# Patient Record
Sex: Male | Born: 1985 | Race: White | Hispanic: No | Marital: Married | Smoking: Never smoker
Health system: Southern US, Community
[De-identification: ages and names within clinical notes are randomized; demographics above are authoritative.]

## PROBLEM LIST (undated history)

## (undated) DIAGNOSIS — F988 Other specified behavioral and emotional disorders with onset usually occurring in childhood and adolescence: Secondary | ICD-10-CM

## (undated) DIAGNOSIS — K219 Gastro-esophageal reflux disease without esophagitis: Secondary | ICD-10-CM

## (undated) DIAGNOSIS — G4750 Parasomnia, unspecified: Secondary | ICD-10-CM

## (undated) DIAGNOSIS — F1010 Alcohol abuse, uncomplicated: Secondary | ICD-10-CM

## (undated) DIAGNOSIS — G4710 Hypersomnia, unspecified: Secondary | ICD-10-CM

## (undated) HISTORY — DX: Parasomnia, unspecified: G47.50

## (undated) HISTORY — PX: TONSILLECTOMY: SUR1361

## (undated) HISTORY — DX: Alcohol abuse, uncomplicated: F10.10

## (undated) HISTORY — PX: WISDOM TOOTH EXTRACTION: SHX21

## (undated) HISTORY — PX: OTHER SURGICAL HISTORY: SHX169

## (undated) HISTORY — DX: Hypersomnia, unspecified: G47.10

## (undated) HISTORY — DX: Gastro-esophageal reflux disease without esophagitis: K21.9

## (undated) HISTORY — DX: Other specified behavioral and emotional disorders with onset usually occurring in childhood and adolescence: F98.8

---

## 2006-06-02 ENCOUNTER — Emergency Department: Payer: Self-pay | Admitting: Internal Medicine

## 2006-11-21 ENCOUNTER — Ambulatory Visit: Payer: Self-pay | Admitting: Internal Medicine

## 2006-11-21 DIAGNOSIS — G5600 Carpal tunnel syndrome, unspecified upper limb: Secondary | ICD-10-CM | POA: Insufficient documentation

## 2006-11-21 DIAGNOSIS — K219 Gastro-esophageal reflux disease without esophagitis: Secondary | ICD-10-CM | POA: Insufficient documentation

## 2006-11-21 DIAGNOSIS — F988 Other specified behavioral and emotional disorders with onset usually occurring in childhood and adolescence: Secondary | ICD-10-CM | POA: Insufficient documentation

## 2006-12-06 ENCOUNTER — Encounter (INDEPENDENT_AMBULATORY_CARE_PROVIDER_SITE_OTHER): Payer: Self-pay | Admitting: Internal Medicine

## 2006-12-12 ENCOUNTER — Telehealth (INDEPENDENT_AMBULATORY_CARE_PROVIDER_SITE_OTHER): Payer: Self-pay | Admitting: *Deleted

## 2007-01-11 ENCOUNTER — Ambulatory Visit: Payer: Self-pay | Admitting: Internal Medicine

## 2007-01-11 DIAGNOSIS — M24419 Recurrent dislocation, unspecified shoulder: Secondary | ICD-10-CM | POA: Insufficient documentation

## 2007-01-11 DIAGNOSIS — J309 Allergic rhinitis, unspecified: Secondary | ICD-10-CM | POA: Insufficient documentation

## 2007-01-11 DIAGNOSIS — G2581 Restless legs syndrome: Secondary | ICD-10-CM | POA: Insufficient documentation

## 2007-01-23 ENCOUNTER — Encounter (INDEPENDENT_AMBULATORY_CARE_PROVIDER_SITE_OTHER): Payer: Self-pay | Admitting: Internal Medicine

## 2007-01-24 ENCOUNTER — Ambulatory Visit: Payer: Self-pay | Admitting: Pulmonary Disease

## 2007-01-29 ENCOUNTER — Encounter: Payer: Self-pay | Admitting: Pulmonary Disease

## 2007-01-29 ENCOUNTER — Ambulatory Visit: Payer: Self-pay | Admitting: Family Medicine

## 2007-01-29 ENCOUNTER — Ambulatory Visit (HOSPITAL_BASED_OUTPATIENT_CLINIC_OR_DEPARTMENT_OTHER): Admission: RE | Admit: 2007-01-29 | Discharge: 2007-01-29 | Payer: Self-pay | Admitting: Pulmonary Disease

## 2007-02-05 ENCOUNTER — Ambulatory Visit: Payer: Self-pay | Admitting: Pulmonary Disease

## 2007-02-14 ENCOUNTER — Telehealth (INDEPENDENT_AMBULATORY_CARE_PROVIDER_SITE_OTHER): Payer: Self-pay | Admitting: Internal Medicine

## 2007-02-21 ENCOUNTER — Encounter (INDEPENDENT_AMBULATORY_CARE_PROVIDER_SITE_OTHER): Payer: Self-pay | Admitting: Internal Medicine

## 2007-02-27 ENCOUNTER — Ambulatory Visit: Payer: Self-pay | Admitting: Pulmonary Disease

## 2007-03-01 DIAGNOSIS — G4710 Hypersomnia, unspecified: Secondary | ICD-10-CM

## 2007-03-06 ENCOUNTER — Telehealth (INDEPENDENT_AMBULATORY_CARE_PROVIDER_SITE_OTHER): Payer: Self-pay | Admitting: *Deleted

## 2007-03-07 ENCOUNTER — Telehealth: Payer: Self-pay | Admitting: Pulmonary Disease

## 2007-03-13 ENCOUNTER — Encounter (INDEPENDENT_AMBULATORY_CARE_PROVIDER_SITE_OTHER): Payer: Self-pay | Admitting: Internal Medicine

## 2007-03-22 ENCOUNTER — Encounter: Payer: Self-pay | Admitting: Pulmonary Disease

## 2007-03-25 ENCOUNTER — Encounter: Payer: Self-pay | Admitting: Family Medicine

## 2007-04-03 ENCOUNTER — Telehealth (INDEPENDENT_AMBULATORY_CARE_PROVIDER_SITE_OTHER): Payer: Self-pay | Admitting: *Deleted

## 2007-04-08 ENCOUNTER — Encounter (INDEPENDENT_AMBULATORY_CARE_PROVIDER_SITE_OTHER): Payer: Self-pay | Admitting: Internal Medicine

## 2007-04-29 ENCOUNTER — Ambulatory Visit: Payer: Self-pay | Admitting: Pulmonary Disease

## 2007-04-29 DIAGNOSIS — G478 Other sleep disorders: Secondary | ICD-10-CM

## 2007-05-01 ENCOUNTER — Telehealth (INDEPENDENT_AMBULATORY_CARE_PROVIDER_SITE_OTHER): Payer: Self-pay | Admitting: *Deleted

## 2007-05-03 ENCOUNTER — Telehealth (INDEPENDENT_AMBULATORY_CARE_PROVIDER_SITE_OTHER): Payer: Self-pay | Admitting: Internal Medicine

## 2007-05-21 ENCOUNTER — Telehealth (INDEPENDENT_AMBULATORY_CARE_PROVIDER_SITE_OTHER): Payer: Self-pay | Admitting: *Deleted

## 2007-06-05 ENCOUNTER — Telehealth (INDEPENDENT_AMBULATORY_CARE_PROVIDER_SITE_OTHER): Payer: Self-pay | Admitting: Internal Medicine

## 2007-06-24 ENCOUNTER — Telehealth: Payer: Self-pay | Admitting: Pulmonary Disease

## 2007-06-27 ENCOUNTER — Telehealth (INDEPENDENT_AMBULATORY_CARE_PROVIDER_SITE_OTHER): Payer: Self-pay | Admitting: *Deleted

## 2007-07-05 ENCOUNTER — Telehealth: Payer: Self-pay | Admitting: Family Medicine

## 2007-07-13 ENCOUNTER — Emergency Department: Payer: Self-pay | Admitting: Emergency Medicine

## 2007-07-17 ENCOUNTER — Ambulatory Visit: Payer: Self-pay | Admitting: Gastroenterology

## 2007-07-17 DIAGNOSIS — K920 Hematemesis: Secondary | ICD-10-CM | POA: Insufficient documentation

## 2007-07-31 ENCOUNTER — Telehealth: Payer: Self-pay | Admitting: Pulmonary Disease

## 2007-08-05 ENCOUNTER — Ambulatory Visit: Payer: Self-pay | Admitting: Gastroenterology

## 2007-08-05 ENCOUNTER — Telehealth (INDEPENDENT_AMBULATORY_CARE_PROVIDER_SITE_OTHER): Payer: Self-pay | Admitting: Internal Medicine

## 2007-08-05 ENCOUNTER — Ambulatory Visit: Payer: Self-pay | Admitting: Pulmonary Disease

## 2007-09-02 ENCOUNTER — Telehealth (INDEPENDENT_AMBULATORY_CARE_PROVIDER_SITE_OTHER): Payer: Self-pay | Admitting: Internal Medicine

## 2007-09-10 ENCOUNTER — Telehealth: Payer: Self-pay | Admitting: Pulmonary Disease

## 2007-09-11 ENCOUNTER — Telehealth: Payer: Self-pay | Admitting: Pulmonary Disease

## 2007-10-02 ENCOUNTER — Ambulatory Visit: Payer: Self-pay | Admitting: Family Medicine

## 2007-10-09 ENCOUNTER — Ambulatory Visit: Payer: Self-pay | Admitting: Pulmonary Disease

## 2007-10-29 ENCOUNTER — Telehealth (INDEPENDENT_AMBULATORY_CARE_PROVIDER_SITE_OTHER): Payer: Self-pay | Admitting: Internal Medicine

## 2007-12-16 ENCOUNTER — Telehealth (INDEPENDENT_AMBULATORY_CARE_PROVIDER_SITE_OTHER): Payer: Self-pay | Admitting: Internal Medicine

## 2008-01-09 ENCOUNTER — Ambulatory Visit: Payer: Self-pay | Admitting: Family Medicine

## 2008-01-09 DIAGNOSIS — H60339 Swimmer's ear, unspecified ear: Secondary | ICD-10-CM

## 2008-01-23 ENCOUNTER — Telehealth (INDEPENDENT_AMBULATORY_CARE_PROVIDER_SITE_OTHER): Payer: Self-pay | Admitting: Internal Medicine

## 2008-02-25 ENCOUNTER — Ambulatory Visit: Payer: Self-pay | Admitting: Family Medicine

## 2008-04-13 ENCOUNTER — Telehealth: Payer: Self-pay | Admitting: Family Medicine

## 2008-05-19 ENCOUNTER — Telehealth (INDEPENDENT_AMBULATORY_CARE_PROVIDER_SITE_OTHER): Payer: Self-pay | Admitting: Internal Medicine

## 2008-07-06 ENCOUNTER — Telehealth (INDEPENDENT_AMBULATORY_CARE_PROVIDER_SITE_OTHER): Payer: Self-pay | Admitting: Internal Medicine

## 2008-08-10 ENCOUNTER — Telehealth (INDEPENDENT_AMBULATORY_CARE_PROVIDER_SITE_OTHER): Payer: Self-pay | Admitting: Internal Medicine

## 2008-08-25 ENCOUNTER — Ambulatory Visit: Payer: Self-pay | Admitting: Pulmonary Disease

## 2008-09-08 ENCOUNTER — Telehealth (INDEPENDENT_AMBULATORY_CARE_PROVIDER_SITE_OTHER): Payer: Self-pay | Admitting: Internal Medicine

## 2008-10-05 ENCOUNTER — Telehealth: Payer: Self-pay | Admitting: Family Medicine

## 2008-10-27 ENCOUNTER — Telehealth (INDEPENDENT_AMBULATORY_CARE_PROVIDER_SITE_OTHER): Payer: Self-pay | Admitting: Internal Medicine

## 2009-01-01 ENCOUNTER — Telehealth: Payer: Self-pay | Admitting: Family Medicine

## 2009-02-19 ENCOUNTER — Telehealth (INDEPENDENT_AMBULATORY_CARE_PROVIDER_SITE_OTHER): Payer: Self-pay | Admitting: Internal Medicine

## 2009-04-02 ENCOUNTER — Telehealth (INDEPENDENT_AMBULATORY_CARE_PROVIDER_SITE_OTHER): Payer: Self-pay | Admitting: Internal Medicine

## 2009-04-05 ENCOUNTER — Telehealth (INDEPENDENT_AMBULATORY_CARE_PROVIDER_SITE_OTHER): Payer: Self-pay | Admitting: Internal Medicine

## 2009-05-10 ENCOUNTER — Telehealth: Payer: Self-pay | Admitting: Family Medicine

## 2009-05-31 ENCOUNTER — Telehealth: Payer: Self-pay | Admitting: Family Medicine

## 2009-07-07 ENCOUNTER — Ambulatory Visit: Payer: Self-pay | Admitting: Family Medicine

## 2009-07-07 DIAGNOSIS — J069 Acute upper respiratory infection, unspecified: Secondary | ICD-10-CM | POA: Insufficient documentation

## 2009-08-25 ENCOUNTER — Telehealth: Payer: Self-pay | Admitting: Family Medicine

## 2009-09-23 ENCOUNTER — Telehealth: Payer: Self-pay | Admitting: Family Medicine

## 2009-10-12 ENCOUNTER — Telehealth (INDEPENDENT_AMBULATORY_CARE_PROVIDER_SITE_OTHER): Payer: Self-pay | Admitting: *Deleted

## 2009-10-13 ENCOUNTER — Ambulatory Visit: Payer: Self-pay | Admitting: Pulmonary Disease

## 2009-11-08 ENCOUNTER — Telehealth: Payer: Self-pay | Admitting: Family Medicine

## 2009-12-13 ENCOUNTER — Telehealth: Payer: Self-pay | Admitting: Family Medicine

## 2010-01-17 ENCOUNTER — Telehealth: Payer: Self-pay | Admitting: Family Medicine

## 2010-02-21 ENCOUNTER — Telehealth: Payer: Self-pay | Admitting: Family Medicine

## 2010-04-14 ENCOUNTER — Telehealth: Payer: Self-pay | Admitting: Family Medicine

## 2010-05-18 NOTE — Progress Notes (Signed)
Summary: refill request for adderall  Phone Note Refill Request Call back at Home Phone [phone] Message from:  Patient  Refills Requested: Medication #1:  ADDERALL XR 20 MG  XR24H-CAP 1 once daily for ADHD Please call pt when ready.  Initial call taken by: Lowella Petties CMA,  January 17, 2010 12:23 PM    Prescriptions: ADDERALL XR 20 MG  XR24H-CAP (AMPHETAMINE-DEXTROAMPHETAMINE) 1 once daily for ADHD  #30 x 0   Entered and Authorized by:   Ruthe Mannan MD   Signed by:   Ruthe Mannan MD on 01/17/2010   Method used:   Print then Give to Patient   RxID:   [id number]   Appended Document: refill request for adderall Patient advised Rx ready for pick up will be left at front desk.

## 2010-05-18 NOTE — Progress Notes (Signed)
Summary: adderall  Phone Note Refill Request Call back at Home Phone [phone] Message from:  Patient on May 10, 2009 10:17 AM  Refills Requested: Medication #1:  ADDERALL XR 20 MG  XR24H-CAP 1 once daily for ADHD   Supply Requested: 1 month will pick up when ready [phone]   Method Requested: Pick up at Office Initial call taken by: Benny Lennert CMA Duncan Dull),  May 10, 2009 10:17 AM  Follow-up for Phone Call        Patient notified that rx is up front and ready for pickup. Follow-up by: Sydell Axon LPN,  May 10, 2009 3:18 PM    Prescriptions: ADDERALL XR 20 MG  XR24H-CAP (AMPHETAMINE-DEXTROAMPHETAMINE) 1 once daily for ADHD  #30 x 0   Entered and Authorized by:   Shaune Leeks MD   Signed by:   Shaune Leeks MD on 05/10/2009   Method used:   Print then Give to Patient   RxID:   [id number]

## 2010-05-18 NOTE — Progress Notes (Signed)
Summary: refill denied until appt   Phone Note Call from Patient Call back at [facility] Phone [phone]   Caller: Patient Call For: clance Reason for Call: Refill Medication Summary of Call: Needs refill for claritin 10mg .//[facility] Initial call taken by: Darletta Moll,  October 12, 2009 11:19 AM  Follow-up for Phone Call        Spoke with pt.  He is actually requesting refill on his klonpin.  I advised that he has to sched appt for med refills since it has been over 1 yr since last seen by [facility].  Appt was sched for tommorrow am at 10:45 am. Follow-up by: Vernie Murders,  October 12, 2009 11:54 AM

## 2010-05-18 NOTE — Assessment & Plan Note (Signed)
Summary: rov for parasomnia   Primary Provider/Referring Provider:  Hetty Ely  CC:  Pt is here for a 1 yr f/u appt.   Pt was last seen May 2010.   Pt requesting refills on Klonopin.  Pt denied any complaints with sleep.  Pt states occ nasal congestion but states this is d/t allergies..  History of Present Illness: the pt comes in today for f/u of his known parasomnias.  He has been on klonopin with good results, and reports no abnormal behavior episodes since his last visit.  He has not been having a lot of insomnia issues recently, and is still taking stimulant medication for his daytime hypersomnia.  Allergies (verified): 1)  ! Pcn 2)  Penicillin  Review of Systems       The patient complains of nasal congestion/difficulty breathing through nose.  The patient denies shortness of breath with activity, shortness of breath at rest, productive cough, non-productive cough, coughing up blood, chest pain, irregular heartbeats, acid heartburn, indigestion, loss of appetite, weight change, abdominal pain, difficulty swallowing, sore throat, tooth/dental problems, headaches, sneezing, itching, ear ache, anxiety, depression, hand/feet swelling, joint stiffness or pain, rash, change in color of mucus, and fever.    Vital Signs:  Patient profile:   25 year old male Height:      72 inches Weight:      260 pounds BMI:     35.39 O2 Sat:      96 % on Room air Temp:     98.1 degrees F oral Pulse rate:   72 / minute BP sitting:   112 / 72  (left arm) Cuff size:   large  Vitals Entered By: Arman Filter LPN (October 13, 2009 10:51 AM)  O2 Flow:  Room air CC: Pt is here for a 1 yr f/u appt.   Pt was last seen May 2010.   Pt requesting refills on Klonopin.  Pt denied any complaints with sleep.  Pt states occ nasal congestion but states this is d/t allergies. Comments Medications reviewed with patient Arman Filter LPN  October 13, 2009 10:51 AM    Physical Exam  General:  30 male in  nad   Impression & Recommendations:  Problem # 1:  PARASOMNIA (ICD-780.59) the pt does well as long as he stays on his klonopine.  I have asked him to continue with this, and to f/u in one year.  Problem # 2:  HYPERSOMNIA UNSPECIFIED (ICD-780.54) He continues to take stimulant medication for this symptom.  I have tried to get him to work this up with mslt, but he states that he is unable to get time off from work to do so.  I have raised the question whether adderall could be contributing to his insomnia issues?    Other Orders: Est. Patient Level II (09811)  Patient Instructions: 1)  no change in meds 2)  followup with me in one year.   Prescriptions: KLONOPINE 0.5MG  1-2 by mouth at bedtime  #60 x 12   Entered and Authorized by:   Barbaraann Share MD   Signed by:   Barbaraann Share MD on 10/13/2009   Method used:   Print then Give to Patient   RxID:   [id number]

## 2010-05-18 NOTE — Assessment & Plan Note (Signed)
Summary: feeling sick, renew meds/  Billie[facility] pt   Vital Signs:  Patient profile:   25 year old male Height:      72 inches Weight:      205.50 pounds BMI:     27.97 Temp:     98.4 degrees F oral Pulse rate:   88 / minute Pulse rhythm:   regular BP sitting:   122 / 82  (left arm) Cuff size:   large  Vitals Entered By: Delilah Shan CMA Duncan Dull) (July 07, 2009 2:43 PM) CC: Renew medications.    Cough, congestion. R  History of Present Illness: 25 yo new to me here for ADHD with complaint of cough and congestion.  ADHD- has been on and off Adderall since he was having trouble sleeping.  He thinks that the Klonopin helps with his sleep and the Adderall is not really affecting his sleep.  He really needs it to help him concentrate at school.  Does well with 20 mg dosing.    Congestion- has had runny nose, dry cough for two days.  No fevers, chills, shortness of breath. Taking Mucinex with mild relief of symptoms.    GERD- has deteriorated since he ran out of Nexium.  Sometimes awaken at night by burning in throat.  Tried to get refills from pharmacy but was told he needed to make appt.     Current Medications (verified): 1)  Nexium 40 Mg  Cpdr (Esomeprazole Magnesium) .... Take One Tab By Mouth Once Daily 2)  Klonopine 0.5mg  .... 1-2 By Mouth At Bedtime 3)  Adderall Xr 20 Mg  Xr24h-Cap (Amphetamine-Dextroamphetamine) .Marland Kitchen.. 1 Once Daily For Adhd 4)  Claritin 10 Mg Tabs (Loratadine) .... Take 1 Tablet By Mouth Once A Day  Allergies (verified): 1)  ! Pcn 2)  Penicillin  Review of Systems      See HPI General:  Denies chills and fever. ENT:  Complains of nasal congestion; denies sinus pressure. CV:  Denies chest pain or discomfort. Resp:  Complains of cough; denies shortness of breath, sputum productive, and wheezing.  Physical Exam  General:  wd male in nad Ears:  right ear canal clear, TM with white fluid/scarring, left ear canal tender, erythematous and diffusely swollen,  Tm with white fluid, scarring, no erythema and nor bulging. Nose:  no nasal discharge but some mucosal pallor.   Mouth:  Oral mucosa and oropharynx without lesions or exudates.  Teeth in good repair. MMM Lungs:  normal respiratory effort, no intercostal retractions, no accessory muscle use, and normal breath sounds.   Heart:  normal rate, regular rhythm, and no murmur.   Psych:  Oriented X3, not anxious appearing, and not depressed appearing.     Impression & Recommendations:  Problem # 1:  ADD (ICD-314.00) Assessment Unchanged Stable.  Refilled his Adderall.  Problem # 2:  URI (ICD-465.9) Assessment: New Likely viral, encouraged continued supportive care with Mucinex.  RTC if no improvement in 7-10 days. His updated medication list for this problem includes:    Claritin 10 Mg Tabs (Loratadine) .Marland Kitchen... Take 1 tablet by mouth once a day  Problem # 3:  GERD (ICD-530.81) Assessment: Deteriorated Ran out of Nexium.  Given samples today and refilled his prescription. His updated medication list for this problem includes:    Nexium 40 Mg Cpdr (Esomeprazole magnesium) .Marland Kitchen... Take one tab by mouth once daily  Complete Medication List: 1)  Nexium 40 Mg Cpdr (Esomeprazole magnesium) .... Take one tab by mouth once daily 2)  Klonopine 0.5mg   .... 1-2 by mouth at bedtime 3)  Adderall Xr 20 Mg Xr24h-cap (Amphetamine-dextroamphetamine) .Marland Kitchen.. 1 once daily for adhd 4)  Claritin 10 Mg Tabs (Loratadine) .... Take 1 tablet by mouth once a day Prescriptions: ADDERALL XR 20 MG  XR24H-CAP (AMPHETAMINE-DEXTROAMPHETAMINE) 1 once daily for ADHD  #30 x 0   Entered and Authorized by:   Ruthe Mannan MD   Signed by:   Ruthe Mannan MD on 07/07/2009   Method used:   Print then Give to Patient   RxID:   [id number] NEXIUM 40 MG  CPDR (ESOMEPRAZOLE MAGNESIUM) take one tab by mouth once daily  #30 Capsule x 6   Entered and Authorized by:   Ruthe Mannan MD   Signed by:   Ruthe Mannan MD on 07/07/2009   Method used:    Electronically to        [facility] [phone]* (retail)       [address]       [place], [place]  [address]       Ph: [phone] or [phone]       Fax: [phone]   RxID:   [id number]

## 2010-05-18 NOTE — Progress Notes (Signed)
Summary: refill request for adderall  Phone Note Refill Request Call back at Home Phone [phone] Message from:  Patient  Refills Requested: Medication #1:  ADDERALL XR 20 MG  XR24H-CAP 1 once daily for ADHD Please call pt when ready.  Initial call taken by: Lowella Petties CMA,  Aug 25, 2009 9:48 AM    Prescriptions: ADDERALL XR 20 MG  XR24H-CAP (AMPHETAMINE-DEXTROAMPHETAMINE) 1 once daily for ADHD  #30 x 0   Entered and Authorized by:   Ruthe Mannan MD   Signed by:   Ruthe Mannan MD on 08/25/2009   Method used:   Print then Give to Patient   RxID:   [id number]   Appended Document: refill request for adderall Patient Advised. Prescription left at front desk.

## 2010-05-18 NOTE — Progress Notes (Signed)
Summary: refill request for adderall  Phone Note Refill Request Call back at Home Phone [phone] Message from:  Patient  Refills Requested: Medication #1:  ADDERALL XR 20 MG  XR24H-CAP 1 once daily for ADHD Please call pt when ready.  Initial call taken by: Lowella Petties CMA,  December 13, 2009 9:15 AM    Prescriptions: ADDERALL XR 20 MG  XR24H-CAP (AMPHETAMINE-DEXTROAMPHETAMINE) 1 once daily for ADHD  #30 x 0   Entered and Authorized by:   Ruthe Mannan MD   Signed by:   Ruthe Mannan MD on 12/13/2009   Method used:   Print then Give to Patient   RxID:   [id number]   Appended Document: refill request for adderall Patient notified that rx is up front and ready for pickup.

## 2010-05-18 NOTE — Progress Notes (Signed)
Summary: refill request for adderall  Phone Note Refill Request Call back at Home Phone [phone] Message from:  Patient  Refills Requested: Medication #1:  ADDERALL XR 20 MG  XR24H-CAP 1 once daily for ADHD Phoned request from pt, this is a little early but pt is going out of town.  Please call when ready.  Initial call taken by: Lowella Petties CMA,  May 31, 2009 11:56 AM  Follow-up for Phone Call        Pt needs to be seen for next script. He hasn't been seen in well over a year. Follow-up by: Shaune Leeks MD,  May 31, 2009 1:40 PM  Additional Follow-up for Phone Call Additional follow up Details #1::        Advised pt, script placed up front for pickup. Additional Follow-up by: Lowella Petties CMA,  May 31, 2009 3:33 PM    Prescriptions: ADDERALL XR 20 MG  XR24H-CAP (AMPHETAMINE-DEXTROAMPHETAMINE) 1 once daily for ADHD  #30 x 0   Entered and Authorized by:   Shaune Leeks MD   Signed by:   Shaune Leeks MD on 05/31/2009   Method used:   Print then Give to Patient   RxID:   [id number]

## 2010-05-18 NOTE — Progress Notes (Signed)
Summary: adderall   Phone Note Refill Request Message from:  Patient on February 21, 2010 11:12 AM  Refills Requested: Medication #1:  ADDERALL XR 20 MG  XR24H-CAP 1 once daily for ADHD Please call patient when ready.    Method Requested: Pick up at Office Initial call taken by: Melody Comas,  February 21, 2010 11:13 AM    Prescriptions: ADDERALL XR 20 MG  XR24H-CAP (AMPHETAMINE-DEXTROAMPHETAMINE) 1 once daily for ADHD  #30 x 0   Entered and Authorized by:   Ruthe Mannan MD   Signed by:   Ruthe Mannan MD on 02/21/2010   Method used:   Print then Give to Patient   RxID:   [id number]   Appended Document: adderall  Patient advised as instructed via telephone Rx ready for pick up will be left at front desk.

## 2010-05-18 NOTE — Progress Notes (Signed)
Summary: adderall  Phone Note Refill Request Call back at Home Phone [phone] Message from:  Patient on September 23, 2009 4:10 PM  Refills Requested: Medication #1:  ADDERALL XR 20 MG  XR24H-CAP 1 once daily for ADHD Please call patient when ready.    Method Requested: Pick up at Office Initial call taken by: Melody Comas,  September 23, 2009 4:11 PM Caller: Patient    Prescriptions: ADDERALL XR 20 MG  XR24H-CAP (AMPHETAMINE-DEXTROAMPHETAMINE) 1 once daily for ADHD  #30 x 0   Entered and Authorized by:   Ruthe Mannan MD   Signed by:   Ruthe Mannan MD on 09/23/2009   Method used:   Print then Give to Patient   RxID:   [id number]   Appended Document: adderall Patient advised Rx ready for pick up will be left at front desk.

## 2010-05-18 NOTE — Progress Notes (Signed)
Summary: refill request for adderall  Phone Note Refill Request Call back at Home Phone [phone] Message from:  Patient  Refills Requested: Medication #1:  ADDERALL XR 20 MG  XR24H-CAP 1 once daily for ADHD Please call when ready.  Initial call taken by: Lowella Petties CMA,  November 08, 2009 12:33 PM    Prescriptions: ADDERALL XR 20 MG  XR24H-CAP (AMPHETAMINE-DEXTROAMPHETAMINE) 1 once daily for ADHD  #30 x 0   Entered and Authorized by:   Ruthe Mannan MD   Signed by:   Ruthe Mannan MD on 11/08/2009   Method used:   Print then Give to Patient   RxID:   [id number]   Appended Document: refill request for adderall Patient notified via telephone, Rx left at front desk for pick up

## 2010-05-19 NOTE — Progress Notes (Signed)
Summary: refill request for adderall  Phone Note Refill Request Call back at Home Phone [phone] Message from:  Patient  Refills Requested: Medication #1:  ADDERALL XR 20 MG  XR24H-CAP 1 once daily for ADHD Please call pt when ready.  Initial call taken by: Lowella Petties CMA, AAMA,  April 14, 2010 8:32 AM    Prescriptions: ADDERALL XR 20 MG  XR24H-CAP (AMPHETAMINE-DEXTROAMPHETAMINE) 1 once daily for ADHD  #30 x 0   Entered and Authorized by:   Ruthe Mannan MD   Signed by:   Ruthe Mannan MD on 04/14/2010   Method used:   Print then Give to Patient   RxID:   [id number]   Appended Document: refill request for adderall Left message on cell phone voicemail, Rx ready for pick up will be left at front desk.

## 2010-06-03 ENCOUNTER — Telehealth: Payer: Self-pay | Admitting: Family Medicine

## 2010-06-08 NOTE — Progress Notes (Signed)
Summary: adderall   Phone Note Refill Request Message from:  Patient on June 03, 2010 9:10 AM  Refills Requested: Medication #1:  ADDERALL XR 20 MG  XR24H-CAP 1 once daily for ADHD  Method Requested: Pick up at Office Initial call taken by: Melody Comas,  June 03, 2010 9:10 AM  Follow-up for Phone Call        Message left on cell phone voicemail, Rx ready for pick up will be left at front desk. Follow-up by: Linde Gillis CMA Duncan Dull),  June 03, 2010 10:10 AM    Prescriptions: ADDERALL XR 20 MG  XR24H-CAP (AMPHETAMINE-DEXTROAMPHETAMINE) 1 once daily for ADHD  #30 x 0   Entered and Authorized by:   Ruthe Mannan MD   Signed by:   Ruthe Mannan MD on 06/03/2010   Method used:   Print then Give to Patient   RxID:   [id number]

## 2010-07-14 ENCOUNTER — Other Ambulatory Visit: Payer: Self-pay | Admitting: *Deleted

## 2010-07-14 MED ORDER — AMPHETAMINE-DEXTROAMPHET ER 20 MG PO CP24: 20.0000 mg | ORAL_CAPSULE | ORAL | Status: DC

## 2010-07-14 NOTE — Telephone Encounter (Signed)
Patient advised via telephone, Rx ready for pick up will be left at front desk. 

## 2010-08-09 ENCOUNTER — Other Ambulatory Visit: Payer: Self-pay | Admitting: *Deleted

## 2010-08-09 MED ORDER — ESOMEPRAZOLE MAGNESIUM 40 MG PO CPDR: 40.0000 mg | DELAYED_RELEASE_CAPSULE | Freq: Every day | ORAL | Status: DC

## 2010-08-19 ENCOUNTER — Other Ambulatory Visit: Payer: Self-pay | Admitting: *Deleted

## 2010-08-22 MED ORDER — AMPHETAMINE-DEXTROAMPHET ER 20 MG PO CP24: ORAL_CAPSULE | ORAL | Status: DC

## 2010-08-22 NOTE — Telephone Encounter (Signed)
Patient advised as instructed via telephone, Rx ready for pick up will be left at front desk. 

## 2010-08-30 NOTE — Procedures (Signed)
Russell Kane, LOH NO.:  [id number]   MEDICAL RECORD NO.:  [id number]          PATIENT TYPE:  OUT   LOCATION:  SLEEP CENTER                 FACILITY[facility]  [facility]   PHYSICIAN:  Barbaraann Share, MD,FCCPDATE OF BIRTH:  August 07, 1983   DATE OF STUDY:  01/29/2007                            NOCTURNAL POLYSOMNOGRAM   REFERRING PHYSICIAN:  Barbaraann Share, MD,FCCP   INDICATION FOR STUDY:  Hypersomnia with sleep apnea.   EPWORTH SLEEPINESS SCORE:  17   MEDICATIONS:   SLEEP ARCHITECTURE:  Patient had total sleep time of 367 minutes with no  slow wave sleep and significantly decreased rim.  Sleep onset latency  was normal and REM onset was very prolonged at 254 minutes.  Sleep  efficiency was decreased at 89%.   RESPIRATORY DATA:  Patient was found to have 8 obstructive hypopneas and  no obstructive apneas for an apnea hypopnea index of 1.3 events per  hour.  Events were not positional but occur more frequently during REM.  Moderate snoring was noted throughout.   OXYGEN DATA:  There was O2 desaturation as low as 91% with the patient's  obstructive events.   CARDIAC DATA:  No clinically significant arrhythmias.   MOVEMENT-PARASOMNIA:  Small numbers of leg jerks were seen without  significant sleep disruption.   IMPRESSIONS-RECOMMENDATIONS:  Small numbers of obstructive events which  do not meet the apnea hypopnea index criteria for the obstructive sleep  apnea syndrome.  The patient did have very little slow wave sleep and  REM, and therefore his degree of sleep apnea is probably underestimated.  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  Clinical correlation is suggested.     Barbaraann Share, MD,FCCP  Diplomate, American Board of Sleep  Medicine  Electronically Signed    [facility]/MEDQ  D:  02/05/2007 07:10:33  T:  02/05/2007 15:25:47   Job:  [id number]

## 2010-08-30 NOTE — Assessment & Plan Note (Signed)
[facility]                         GASTROENTEROLOGY OFFICE NOTE   AUBREY, VOONG                      MRN:          [id number]  DATE:07/17/2007                            DOB:          07-Jun-1985    REFERRING PHYSICIAN:  Billie D. Bean, FNP   REASON FOR CONSULTATION:  Hematemesis.   Mr. Vanwart is a 25 year old white male, referred through the emergency  room for evaluation.  Approximately five days ago, he presented with  vomiting of coffee-ground material.  This was followed by frank limited  hematemesis.  The ER records are not yet available.  He was told that  his liver enzymes were elevated and that his stools tested positive for  blood.  He was released on Nexium twice a day.  He has no history of  melena or hematemesis.  He has pyrosis, which is well-controlled with  Nexium.  He takes ibuprofen intermittently for headaches.  He admits to  drinking an average of up to twelve beers daily.  He denies dysphagia,  except for dysphagia to pills.   PAST MEDICAL HISTORY:  Pertinent for sleep apnea.  He is status post  tonsillectomy,   FAMILY HISTORY:  Unremarkable.   MEDICATIONS:  Include Nexium 40 mg twice a day, Adderall and Klonopin.   ALLERGIES:  He is allergic to PENICILLIN.  He does not smoke.  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   REVIEW OF SYSTEMS:  Positive for insomnia.   PHYSICAL EXAMINATION:  Pulse 80, blood pressure 112/80, weight 245.  HEENT: EOMI.  PERRLA.  Sclerae are anicteric.  Conjunctivae are pink.  NECK:  Supple without thyromegaly, adenopathy or carotid bruits.  CHEST:  Clear to auscultation and percussion without adventitious  sounds.  CARDIAC:  Regular rhythm; normal S1 S2.  There are no murmurs, gallops  or rubs.  ABDOMEN:  Bowel sounds are normoactive.  Abdomen is soft, nontender and  nondistended.  There are no abdominal masses, tenderness, splenic  enlargement or hepatomegaly.  EXTREMITIES:  Full  range of motion.  No cyanosis, clubbing or edema.  RECTAL:  Deferred.   IMPRESSION:  1. Limited hematemesis.  He likely has active peptic disease or      possibly Mallory-Weiss tear.  Acute alcoholic gastritis is another      consideration.  2. Alcohol abuse.  3. History of what sounds like alcoholic hepatitis.  4. Sleep apnea.   RECOMMENDATION:  1. Continue Nexium.  2. Upper endoscopy.  3. Patient was counseled for his obvious alcohol problem.     Barbette Hair. Arlyce Dice, MD,FACG  Electronically Signed    RDK/MedQ  DD: 07/17/2007  DT: 07/17/2007  Job #: [id number]   cc:   Billie D. Bean, FNP

## 2010-08-30 NOTE — Assessment & Plan Note (Signed)
[facility]                             PULMONARY OFFICE NOTE   Russell Kane, Russell Kane                      MRN:          [id number]  DATE:01/24/2007                            DOB:          [birth year]    HISTORY OF PRESENT ILLNESS:  The patient is a 25 year old white male who  comes in today for multiple sleep complaints. The patient was recently  in the [employer] and had some sleep issues due to stress, as well as  family issues. This has continued [place] he never feels rested even after a full night of sleep. He  always has had strange dreams during his life, but he denies abnormal  behaviors during the night to act out his dreams. The patient typically  goes to bed between 8 and 10 and gets up between 3 and 6 to start his  day. He is not rested upon arising. He has been noted to have loud  snoring, as well as pauses in his breathing during sleep. He does have a  few awakenings during the night. 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 He does note sleep  pressure with periods of inactivity during the day while doing paperwork  or awaiting on a space at the loading dock. He has some mild sleepiness  with the longer drives, but he is able to do okay. The patient states  that he will fall asleep quite easily in the evenings watching TV or a  movie. The patient has also had some unusual sensation in his legs and  feet if he holds them still. It is made better with movement. He also  notes kicking, according to his girlfriend, during the night. He has  been placed on Requip and titrated up to 1 mg nightly and feels that it  helped for a few nights and then the kicking restarted. 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   PAST MEDICAL HISTORY:  Significant for allergic rhinitis. Otherwise,  unremarkable.   MEDICATIONS:  1. Nexium 40 mg daily.  2. Requip 1 mg daily.  3. Adderall 20 mg daily.   ALLERGIES:  PENICILLIN.   SOCIAL HISTORY:  The patient is divorced and does not have children. He  has smoked socially in the past, but not currently.   FAMILY HISTORY:  Remarkable for his mother having heart disease.   REVIEW OF SYSTEMS:  As per history of present illness. Also see the  patient intake form documented on the chart.   PHYSICAL EXAMINATION:  GENERAL:  He is an overweight male in no acute  distress.  VITAL SIGNS:  Blood pressure 126/88, pulse 90, temperature 98.1, weight  251 pounds, height 6 feet 1 inches, O2 saturation on room air is 97%.  HEENT:  Pupils equal, round, and reactive to light and accommodation.  Extraocular muscles are intact. Nares are patent with mild narrowing on  the left. Oropharynx does show mild to moderate  elongation of the soft  palette and uvula.  NECK:  Supple without JVD or lymphadenopathy. There is no palpable  thyromegaly.  CHEST:  Totally clear.  CARDIAC:  Regular rate and rhythm with no murmurs, rubs, or gallops.  ABDOMEN:  Soft and nontender with good bowel sounds.  GENITAL:  Not done and not indicated.  RECTAL:  Not done and not indicated.  BREAST:  Not done and not indicated.  LOWER EXTREMITIES:  Without edema, pulses are intact distally.  NEUROLOGIC:  Alert and oriented with no obvious motor deficits.   IMPRESSION:  Probable obstructive sleep apnea. The patient gives a very  good history that is suggestive of this and also has some upper airway  anatomy issues. He is clearly overweight for his height. He also has a  lot of other issues, such as restless legs, as well as a lot of unusual  dreams. I think that at this point in time in order to better clarify  his sleep problem that he would benefit from a sleep study. The patient  is agreeable to this approach.   PLAN:  1. Schedule for nocturnal polysomnography.  2. Work  on weight loss.  3. The patient will follow up after the above.     Barbaraann Share, MD,FCCP  Electronically Signed    [facility]  DD: 01/24/2007  DT: 01/25/2007  Job #: [id number]   cc:   Billie D. Bean, FNP

## 2010-09-27 ENCOUNTER — Other Ambulatory Visit: Payer: Self-pay | Admitting: *Deleted

## 2010-09-27 NOTE — Telephone Encounter (Signed)
Please call pt when ready.

## 2010-09-28 ENCOUNTER — Other Ambulatory Visit: Payer: Self-pay | Admitting: *Deleted

## 2010-09-28 MED ORDER — AMPHETAMINE-DEXTROAMPHET ER 20 MG PO CP24: ORAL_CAPSULE | ORAL | Status: DC

## 2010-09-28 NOTE — Telephone Encounter (Signed)
Patient advised as instructed Rx ready for pick up will be left at front desk.

## 2010-09-28 NOTE — Telephone Encounter (Signed)
Opened in error

## 2010-10-06 ENCOUNTER — Other Ambulatory Visit: Payer: Self-pay | Admitting: *Deleted

## 2010-10-06 MED ORDER — ESOMEPRAZOLE MAGNESIUM 40 MG PO CPDR: 40.0000 mg | DELAYED_RELEASE_CAPSULE | Freq: Every day | ORAL | Status: DC

## 2010-11-04 ENCOUNTER — Telehealth: Payer: Self-pay | Admitting: Pulmonary Disease

## 2010-11-04 MED ORDER — CLONAZEPAM 0.5 MG PO TABS: 0.5000 mg | ORAL_TABLET | Freq: Every day | ORAL | Status: DC

## 2010-11-04 NOTE — Telephone Encounter (Signed)
Called and spoke with pt and he stated that he is in between jobs at this time and his new insurance does not kick in until sept. And this is when he will be able to come in for appt.  Pt is requesting refill of his klonopin 0.5  1 tablet daily.  Please advise if ok to fill.  Pt was last seen on 09-2009.  thanks

## 2010-11-04 NOTE — Telephone Encounter (Signed)
Can have 30 with one fill only .

## 2010-11-04 NOTE — Telephone Encounter (Signed)
LMOM for pt to be aware rx was called to pharm x 1 only. Needs ov for additional rx.

## 2010-11-11 ENCOUNTER — Other Ambulatory Visit: Payer: Self-pay | Admitting: *Deleted

## 2010-11-11 MED ORDER — AMPHETAMINE-DEXTROAMPHET ER 20 MG PO CP24: ORAL_CAPSULE | ORAL | Status: DC

## 2010-11-14 NOTE — Telephone Encounter (Signed)
Left message on cell phone voicemail that Rx is ready for pick up will be left at front desk. 

## 2010-11-30 ENCOUNTER — Telehealth: Payer: Self-pay | Admitting: Pulmonary Disease

## 2010-11-30 NOTE — Telephone Encounter (Signed)
According to centricity and all previous ov notes and refills the directions for the pt Klonopin is 1-2 tablets daily as needed #60. Rx was send for one tablet a day #30.  So I called pharmacy and corrected the rx. Pt aware.Carron Curie, CMA

## 2010-12-30 ENCOUNTER — Encounter: Payer: Self-pay | Admitting: Pulmonary Disease

## 2011-01-02 ENCOUNTER — Encounter: Payer: Self-pay | Admitting: Pulmonary Disease

## 2011-01-02 ENCOUNTER — Ambulatory Visit (INDEPENDENT_AMBULATORY_CARE_PROVIDER_SITE_OTHER): Payer: BC Managed Care – PPO | Admitting: Pulmonary Disease

## 2011-01-02 VITALS — BP 122/88 | HR 83 | Temp 98.0°F | Ht 73.0 in | Wt 273.4 lb

## 2011-01-02 DIAGNOSIS — G4710 Hypersomnia, unspecified: Secondary | ICD-10-CM

## 2011-01-02 DIAGNOSIS — G478 Other sleep disorders: Secondary | ICD-10-CM

## 2011-01-02 MED ORDER — CLONAZEPAM 0.5 MG PO TABS: 0.5000 mg | ORAL_TABLET | Freq: Every day | ORAL | Status: DC

## 2011-01-02 NOTE — Assessment & Plan Note (Signed)
The patient has been doing well on Klonopin, and has not had any nocturnal behavioral abnormalities since the last visit.  He feels that he is sleeping well overall, but continues to have some daytime sleepiness.  He is on Adderall for ADD, and has declined in the past further workup with MSLT.  I have asked him to continue on Klonopin, and continue working on good sleep hygiene.  I have also asked him to work aggressively on weight loss.

## 2011-01-02 NOTE — Progress Notes (Signed)
  Subjective:    Patient ID: Russell Kane, male    DOB: 03-18-1986, 25 y.o.   MRN: [id number]  HPI The patient comes in today for followup of his parasomnias.  He has been on Klonopin with a significant improvement in his sleep.  He has had no further "episodes" since his last visit, and no other abnormal behaviors.  He is continuing to take Adderall for his ADD.  He also has a history of daytime hypersomnia, but has been unwilling to do a MS LT for workup.   Review of Systems  Constitutional: Negative for fever and unexpected weight change.  HENT: Positive for congestion and rhinorrhea. Negative for ear pain, nosebleeds, sore throat, sneezing, trouble swallowing, dental problem, postnasal drip and sinus pressure.   Eyes: Negative for redness and itching.  Respiratory: Positive for cough. Negative for chest tightness, shortness of breath and wheezing.   Cardiovascular: Negative for palpitations and leg swelling.  Gastrointestinal: Negative for nausea and vomiting.  Genitourinary: Negative for dysuria.  Musculoskeletal: Negative for joint swelling.  Skin: Negative for rash.  Neurological: Negative for headaches.  Hematological: Does not bruise/bleed easily.  Psychiatric/Behavioral: Negative for dysphoric mood. The patient is not nervous/anxious.        Objective:   Physical Exam Overweight male in nad Nose without purulence or discharge noted Lower extremities without edema, and no cyanosis noted Alert but appears a little sleepy, moves all 4 extremities.       Assessment & Plan:

## 2011-01-02 NOTE — Patient Instructions (Signed)
No change in klonopin followup with me in one year, or sooner if having breakthru "episodes".   Work on weight reduction.

## 2011-01-03 ENCOUNTER — Telehealth: Payer: Self-pay | Admitting: *Deleted

## 2011-01-03 NOTE — Telephone Encounter (Signed)
Pt's dad called and said that pt was stung by a bee on the end of his tongue a few minutes ago.  He's ok but his tongue hurts.  Advised him to use ice, take benedryl and be aware of any tongue or throat swelling, seek emergency care if that happens.

## 2011-01-03 NOTE — Telephone Encounter (Signed)
agree

## 2011-01-05 ENCOUNTER — Ambulatory Visit (INDEPENDENT_AMBULATORY_CARE_PROVIDER_SITE_OTHER): Payer: BC Managed Care – PPO | Admitting: Family Medicine

## 2011-01-05 ENCOUNTER — Encounter: Payer: Self-pay | Admitting: Family Medicine

## 2011-01-05 VITALS — BP 118/88 | HR 77 | Temp 98.1°F | Wt 270.5 lb

## 2011-01-05 DIAGNOSIS — H6120 Impacted cerumen, unspecified ear: Secondary | ICD-10-CM

## 2011-01-05 DIAGNOSIS — F988 Other specified behavioral and emotional disorders with onset usually occurring in childhood and adolescence: Secondary | ICD-10-CM

## 2011-01-05 DIAGNOSIS — J069 Acute upper respiratory infection, unspecified: Secondary | ICD-10-CM

## 2011-01-05 MED ORDER — AMPHETAMINE-DEXTROAMPHET ER 20 MG PO CP24: ORAL_CAPSULE | ORAL | Status: DC

## 2011-01-05 MED ORDER — AZITHROMYCIN 250 MG PO TABS: ORAL_TABLET | ORAL | Status: AC

## 2011-01-05 NOTE — Patient Instructions (Signed)
Take antibiotic as directed.  Drink lots of fluids.  Treat sympotmatically with Mucinex, nasal saline irrigation, and Tylenol/Ibuprofen. Also try claritin D or zyrtec D over the counter- two times a day as needed ( have to sign for them at pharmacy). You can use warm compresses.  Cough suppressant at night. Call if not improving as expected in 5-7 days.    

## 2011-01-05 NOTE — Progress Notes (Signed)
  Subjective:    Patient ID: Russell Kane, male    DOB: 1986/02/26, 25 y.o.   MRN: [id number]  HPI The patient comes in today for followup ADHD.   Symptoms well controlled on Adderrall XR 20 mg daily.  URI symptoms- on prednisone currently for bee sting which has helped with cough. Has sinus pressure, more congestion and teeth pressure. No fevers.  Cerumen impaction    Review of Systems  Constitutional: Negative for fever and unexpected weight change.  HENT: Positive for congestion and rhinorrhea. Negative for ear pain, nosebleeds, sore throat, sneezing, trouble swallowing, dental problem, postnasal drip and sinus pressure.   Eyes: Negative for redness and itching.  Respiratory: Positive for cough. Negative for chest tightness, shortness of breath and wheezing.   Cardiovascular: Negative for palpitations and leg swelling.  Gastrointestinal: Negative for nausea and vomiting.  Genitourinary: Negative for dysuria.  Musculoskeletal: Negative for joint swelling.  Skin: Negative for rash.  Neurological: Negative for headaches.  Hematological: Does not bruise/bleed easily.  Psychiatric/Behavioral: Negative for dysphoric mood. The patient is not nervous/anxious.        Objective:   Physical Exam BP 118/88  Pulse 77  Temp(Src) 98.1 F (36.7 C) (Oral)  Wt 270 lb 8 oz (122.698 kg)  Overweight male in nad Cerumen impaction bilaterally, left >right Pos erythema and swelling of nasal turbinates Lungs CTA bilaterally RRR Lower extremities without edema, and no cyanosis noted Alert but appears a little sleepy, moves all 4 extremities.       Assessment & Plan:   1. ADD   Stable on current dose of Adderral.  Refill given.    2. Cerumen impaction  Ceruminosis is noted.  Wax is removed by syringing and manual debridement. Instructions for home care to prevent wax buildup are given.   3. URI   Given duration and progression of symptoms, will treat for bacterial sinusitis. See  pt instructions for details.

## 2011-01-06 ENCOUNTER — Other Ambulatory Visit: Payer: Self-pay | Admitting: Family Medicine

## 2011-01-23 ENCOUNTER — Telehealth: Payer: Self-pay | Admitting: Pulmonary Disease

## 2011-01-23 NOTE — Telephone Encounter (Signed)
yes

## 2011-01-23 NOTE — Telephone Encounter (Signed)
Called, spoke with pt.  He has previously been receiving Klonopin 0.5mg  tablets with the sig take 1-2 tablets po qhs # 60. Pt states he has been taking the 2 tablets.  During his last OV on 01/02/11 with KC, klonopin rx was written for 0.5mg  1 tablet po qd.  # 30.  Pt has already used this up because he has been taking 2 tablets -- he would like the rx changed.  Dr. Shelle Iron, are you ok with klonopin rx being changed to 0.5mg  take 1-2 tabs po qhs # 60?

## 2011-01-24 MED ORDER — CLONAZEPAM 0.5 MG PO TABS: ORAL_TABLET | ORAL | Status: DC

## 2011-01-24 NOTE — Telephone Encounter (Signed)
New rx called to pharm and pt aware.

## 2011-01-31 ENCOUNTER — Other Ambulatory Visit: Payer: Self-pay | Admitting: *Deleted

## 2011-01-31 MED ORDER — AMPHETAMINE-DEXTROAMPHET ER 20 MG PO CP24: ORAL_CAPSULE | ORAL | Status: DC

## 2011-01-31 NOTE — Telephone Encounter (Signed)
Please call patient when ready. 

## 2011-01-31 NOTE — Telephone Encounter (Signed)
Rx in your IN box for signature.

## 2011-02-01 NOTE — Telephone Encounter (Signed)
Left message on cell phone voicemail advising patient that Rx is ready for pick up will be left at front desk. 

## 2011-04-17 ENCOUNTER — Other Ambulatory Visit: Payer: Self-pay | Admitting: Internal Medicine

## 2011-04-17 MED ORDER — AMPHETAMINE-DEXTROAMPHET ER 20 MG PO CP24: ORAL_CAPSULE | ORAL | Status: DC

## 2011-04-17 NOTE — Telephone Encounter (Signed)
LAst OV 12/2010, last refill 01/2011. Okay to fill.

## 2011-04-17 NOTE — Telephone Encounter (Signed)
Refill request

## 2011-04-17 NOTE — Telephone Encounter (Signed)
Patient advised.

## 2011-05-18 ENCOUNTER — Other Ambulatory Visit: Payer: Self-pay | Admitting: Family Medicine

## 2011-05-18 MED ORDER — AMPHETAMINE-DEXTROAMPHET ER 20 MG PO CP24: ORAL_CAPSULE | ORAL | Status: DC

## 2011-05-18 NOTE — Telephone Encounter (Signed)
Patient advised via telephone, Rx is ready for pick up will be left at front desk. 

## 2011-05-18 NOTE — Telephone Encounter (Signed)
Requesting Aderol Refill

## 2011-05-22 ENCOUNTER — Ambulatory Visit (INDEPENDENT_AMBULATORY_CARE_PROVIDER_SITE_OTHER): Payer: BC Managed Care – PPO | Admitting: Family Medicine

## 2011-05-22 ENCOUNTER — Encounter: Payer: Self-pay | Admitting: Family Medicine

## 2011-05-22 VITALS — BP 102/78 | HR 80 | Temp 98.0°F | Wt 275.0 lb

## 2011-05-22 DIAGNOSIS — J069 Acute upper respiratory infection, unspecified: Secondary | ICD-10-CM

## 2011-05-22 MED ORDER — AZITHROMYCIN 250 MG PO TABS: ORAL_TABLET | ORAL | Status: AC

## 2011-05-22 MED ORDER — HYDROCOD POLST-CHLORPHEN POLST 10-8 MG/5ML PO LQCR: 5.0000 mL | Freq: Every evening | ORAL | Status: DC | PRN

## 2011-05-22 NOTE — Progress Notes (Signed)
SUBJECTIVE:  Russell Kane is a 26 y.o. male who complains of coryza, congestion and productive cough for 20 days. He denies a history of anorexia, chest pain and chills and denies a history of asthma. Patient denies smoke cigarettes.   Patient Active Problem List  Diagnoses  . ADD  . CARPAL TUNNEL SYNDROME, BILATERAL  . OTITIS EXTERNA, ACUTE  . URI  . ALLERGIC RHINITIS  . GERD  . HEMATEMESIS  . SHOULDER DISLOCATION, RECURRENT  . HYPERSOMNIA UNSPECIFIED  . PARASOMNIA  . Cerumen impaction   Past Medical History  Diagnosis Date  . ETOH abuse   . Parasomnia   . Hypersomnia   . Allergic rhinitis   . GERD (gastroesophageal reflux disease)   . ADD (attention deficit disorder)    Past Surgical History  Procedure Date  . Dislocated shoulder 2004 and 2006    Left  . Tonsillectomy   . Wisdom tooth extraction    History  Substance Use Topics  . Smoking status: Never Smoker   . Smokeless tobacco: Not on file  . Alcohol Use: Not on file   Family History  Problem Relation Age of Onset  . Hypertension Mother   . Heart attack Maternal Grandfather   . Heart disease Maternal Grandfather    Allergies  Allergen Reactions  . Penicillins    Current Outpatient Prescriptions on File Prior to Visit  Medication Sig Dispense Refill  . amphetamine-dextroamphetamine (ADDERALL XR, 20MG ,) 20 MG 24 hr capsule Take one by mouth daily for ADHD  30 capsule  0  . clonazePAM (KLONOPIN) 0.5 MG tablet Take 1 to 2 at bedtime as needed  60 tablet  5  . NEXIUM 40 MG capsule TAKE ONE CAPSULE EVERY DAY BEFORE BREAKFAST  30 capsule  11   The PMH, PSH, Social History, Family History, Medications, and allergies have been reviewed in [facility], and have been updated if relevant.  OBJECTIVE: BP 102/78  Pulse 80  Temp(Src) 98 F (36.7 C) (Oral)  Wt 275 lb (124.739 kg)  He appears well, vital signs are as noted. Ears normal.  Throat and pharynx normal.  Neck supple. No adenopathy in the neck. Nose is  congested. Sinuses non tender. The chest is clear, without wheezes or rales.  ASSESSMENT:  bronchitis  PLAN: Given duration and progression of symptoms, will treat for bacterial process with zpack. Symptomatic therapy suggested: push fluids, rest and return office visit prn if symptoms persist or worsen.  Call or return to clinic prn if these symptoms worsen or fail to improve as anticipated.

## 2011-07-24 ENCOUNTER — Other Ambulatory Visit: Payer: Self-pay

## 2011-07-24 MED ORDER — AMPHETAMINE-DEXTROAMPHET ER 20 MG PO CP24: ORAL_CAPSULE | ORAL | Status: DC

## 2011-07-24 NOTE — Telephone Encounter (Signed)
Pt request written rx Adderall 20 mg. Pt last seen 05/22/11. Pt can be reached [phone] when rx ready for pick up.

## 2011-07-24 NOTE — Telephone Encounter (Signed)
Script placed up front for pick up, advised patient. 

## 2011-08-14 ENCOUNTER — Other Ambulatory Visit: Payer: Self-pay

## 2011-08-14 ENCOUNTER — Telehealth: Payer: Self-pay | Admitting: Pulmonary Disease

## 2011-08-14 MED ORDER — AMPHETAMINE-DEXTROAMPHET ER 20 MG PO CP24: ORAL_CAPSULE | ORAL | Status: DC

## 2011-08-14 MED ORDER — CLONAZEPAM 0.5 MG PO TABS: ORAL_TABLET | ORAL | Status: DC

## 2011-08-14 NOTE — Telephone Encounter (Signed)
Ok to refill since pt was last seen 9/17 and told to f/u in 1 year and already has pending appt on 08/30/11 and he is due for refills.    LMOM for pt to inform him rx sent to pharmacy.

## 2011-08-14 NOTE — Telephone Encounter (Signed)
pt left v/m need written rx Adderall. Pt last seen 05/22/11. When rx ready for pick up please call pt at [phone].

## 2011-08-14 NOTE — Telephone Encounter (Signed)
Pt needing refill on klonopin 0.5 mg 1-2 at bedtime prn. This was filled 02/03/11 #60 x 5 refills. Pt last OV 01/02/11 f/u 1 year. Please advise KC if okay to refill thanks

## 2011-08-14 NOTE — Telephone Encounter (Signed)
Script placed up front for pick up, advised patient. 

## 2011-08-30 ENCOUNTER — Encounter: Payer: Self-pay | Admitting: Pulmonary Disease

## 2011-08-30 ENCOUNTER — Ambulatory Visit (INDEPENDENT_AMBULATORY_CARE_PROVIDER_SITE_OTHER): Payer: BC Managed Care – PPO | Admitting: Pulmonary Disease

## 2011-08-30 VITALS — BP 122/84 | HR 98 | Temp 98.2°F | Ht 73.0 in | Wt 274.2 lb

## 2011-08-30 DIAGNOSIS — G478 Other sleep disorders: Secondary | ICD-10-CM

## 2011-08-30 MED ORDER — CLONAZEPAM 0.5 MG PO TABS: ORAL_TABLET | ORAL | Status: DC

## 2011-08-30 NOTE — Assessment & Plan Note (Signed)
The patient is doing well on his Klonopin, and has not had any breakthrough events.  He feels that he is sleeping well, and his only complaint is that he is not keeping a consistent schedule and does not always get enough sleep.  I've asked him to stay on his medication, and continue working diligently on sleep hygiene.

## 2011-08-30 NOTE — Patient Instructions (Signed)
Stay on klonopin Work on weight loss, lean muscle mass Try to normalize sleep schedule as much as possible, and get enough sleep followup with me in one year if doing well.

## 2011-08-30 NOTE — Progress Notes (Signed)
  Subjective:    Patient ID: Russell Kane, male    DOB: 08-Oct-1985, 26 y.o.   MRN: [id number]  HPI The patient comes in today for followup of his known parasomnia and also daytime sleepiness probably related to inadequate sleep hygiene.  The patient has been staying on Klonopin, and feels that he is sleeping well, and has not had breakthrough events.  He has lost weight, and his fianc feels that his snoring is less.  I have asked him to continue working on this.  He continues to have erratic sleep hours due to his job, but it is better than a year ago.  He is more satisfied with his daytime alertness, but still has sleepiness at times.   Review of Systems  Constitutional: Negative.  Negative for fever and unexpected weight change.  HENT: Negative.  Negative for ear pain, nosebleeds, congestion, sore throat, rhinorrhea, sneezing, trouble swallowing, dental problem, postnasal drip and sinus pressure.   Eyes: Negative.  Negative for redness and itching.  Respiratory: Negative.  Negative for cough, chest tightness, shortness of breath and wheezing.   Cardiovascular: Negative.  Negative for palpitations and leg swelling.  Gastrointestinal: Negative.  Negative for nausea and vomiting.  Genitourinary: Negative.  Negative for dysuria.  Musculoskeletal: Negative.  Negative for joint swelling.  Skin: Negative.  Negative for rash.  Neurological: Negative.  Negative for headaches.  Hematological: Negative.  Does not bruise/bleed easily.  Psychiatric/Behavioral: Negative for dysphoric mood. The patient is not nervous/anxious.        Objective:   Physical Exam Overweight male in no acute distress Large neck noted, nose without purulence or discharge Lower extremities without edema, no cyanosis noted Alert, oriented, moves all 4 extremities.       Assessment & Plan:

## 2011-09-21 ENCOUNTER — Encounter: Payer: Self-pay | Admitting: Internal Medicine

## 2011-09-26 ENCOUNTER — Other Ambulatory Visit: Payer: Self-pay

## 2011-09-26 MED ORDER — AMPHETAMINE-DEXTROAMPHET ER 20 MG PO CP24: ORAL_CAPSULE | ORAL | Status: DC

## 2011-09-26 NOTE — Telephone Encounter (Signed)
Left message advising pt that script is ready for pick up, script placed up front.

## 2011-09-26 NOTE — Telephone Encounter (Signed)
Pt request rx Adderall. Call when ready for pick up. 

## 2011-10-06 ENCOUNTER — Encounter: Payer: Self-pay | Admitting: Internal Medicine

## 2011-10-11 ENCOUNTER — Other Ambulatory Visit (INDEPENDENT_AMBULATORY_CARE_PROVIDER_SITE_OTHER): Payer: BC Managed Care – PPO

## 2011-10-11 ENCOUNTER — Ambulatory Visit (INDEPENDENT_AMBULATORY_CARE_PROVIDER_SITE_OTHER): Payer: BC Managed Care – PPO | Admitting: Internal Medicine

## 2011-10-11 ENCOUNTER — Encounter: Payer: Self-pay | Admitting: Internal Medicine

## 2011-10-11 VITALS — BP 120/86 | HR 88 | Ht 73.0 in | Wt 268.6 lb

## 2011-10-11 DIAGNOSIS — R197 Diarrhea, unspecified: Secondary | ICD-10-CM

## 2011-10-11 DIAGNOSIS — R109 Unspecified abdominal pain: Secondary | ICD-10-CM

## 2011-10-11 LAB — CBC WITH DIFFERENTIAL/PLATELET
Basophils Absolute: 0 10*3/uL (ref 0.0–0.1)
Basophils Relative: 0.7 % (ref 0.0–3.0)
Eosinophils Absolute: 0.1 10*3/uL (ref 0.0–0.7)
Lymphocytes Relative: 27.7 % (ref 12.0–46.0)
MCHC: 33.3 g/dL (ref 30.0–36.0)
Monocytes Relative: 7.5 % (ref 3.0–12.0)
Neutrophils Relative %: 62 % (ref 43.0–77.0)
RBC: 5.18 Mil/uL (ref 4.22–5.81)
RDW: 12.7 % (ref 11.5–14.6)

## 2011-10-11 LAB — COMPREHENSIVE METABOLIC PANEL
ALT: 89 U/L — ABNORMAL HIGH (ref 0–53)
AST: 48 U/L — ABNORMAL HIGH (ref 0–37)
Albumin: 4.1 g/dL (ref 3.5–5.2)
CO2: 25 mEq/L (ref 19–32)
Calcium: 9.5 mg/dL (ref 8.4–10.5)
Chloride: 103 mEq/L (ref 96–112)
Potassium: 4.1 mEq/L (ref 3.5–5.1)
Total Protein: 7.5 g/dL (ref 6.0–8.3)

## 2011-10-11 MED ORDER — DIPHENOXYLATE-ATROPINE 2.5-0.025 MG PO TABS: 1.0000 | ORAL_TABLET | Freq: Four times a day (QID) | ORAL | Status: AC | PRN

## 2011-10-11 MED ORDER — HYOSCYAMINE SULFATE 0.125 MG SL SUBL: 0.1250 mg | SUBLINGUAL_TABLET | SUBLINGUAL | Status: DC | PRN

## 2011-10-11 NOTE — Patient Instructions (Addendum)
Your physician has requested that you go to the basement for lab work before leaving today.  We have sent the following medications to your pharmacy for you to pick up at your convenience: lomotil. Levsin, takes as directed.

## 2011-10-11 NOTE — Progress Notes (Signed)
Patient ID: Russell Kane, male   DOB: Feb 13, 1986, 26 y.o.   MRN: [id number]  SUBJECTIVE: HPI Mr. Russell Kane is a 26 year old male with a past medical history of ADHD, GERD and insomnia who seen in consultation at request of Dr. Dayton Martes for evaluation of diarrhea and intermittent abdominal pain. The patient reports that he has had worsening diarrhea and loose stools over the last few months to a year. He reports that he is now having loose stools on a daily basis. This occurs 4-5 times daily and even at night. Initially when this started it was sporadic and occasional, though he was unaware of any trigger such as certain foods. Now it seems to be daily. This is often associated with a diffuse cramping abdominal pain it's mostly relieved by bowel movement. He's had no rectal bleeding or melena. He notes his diarrhea is often extremely urgent and he has on occasion had accidents. He reports that this is worse after eating, he reports hearing a "gurgling" often within minutes of eating and then an intense urge to defecate. He has tried a gluten-free diet for several weeks and felt like his symptoms improved somewhat but did not abate totally. He has use loperamide 2 mg to avoid diarrhea of events when he is being social or working. He reports occasionally this makes him feel heavy and obstipated. He denies fecal seepage or leakage. He has not started any new medications in the period when his diarrhea worsen. He denies OTC medicines. He does not think his symptoms worsen with stress.  Review of Systems  As per history of present illness, otherwise negative except for difficulty with sleep  Past Medical History  Diagnosis Date  . ETOH abuse   . Parasomnia   . Hypersomnia   . Allergic rhinitis   . GERD (gastroesophageal reflux disease)   . ADD (attention deficit disorder)     Current Outpatient Prescriptions  Medication Sig Dispense Refill  . amphetamine-dextroamphetamine (ADDERALL XR, 20MG ,) 20 MG 24 hr  capsule Take one by mouth daily for ADHD  30 capsule  0  . clonazePAM (KLONOPIN) 0.5 MG tablet Take 1 to 2 at bedtime as needed  60 tablet  5  . NEXIUM 40 MG capsule TAKE ONE CAPSULE EVERY DAY BEFORE BREAKFAST  30 capsule  11  . diphenoxylate-atropine (LOMOTIL) 2.5-0.025 MG per tablet Take 1 tablet by mouth 4 (four) times daily as needed for diarrhea or loose stools.  30 tablet  0  . hyoscyamine (LEVSIN SL) 0.125 MG SL tablet Place 1 tablet (0.125 mg total) under the tongue every 4 (four) hours as needed for cramping.  30 tablet  0    Allergies  Allergen Reactions  . Penicillins     Family History  Problem Relation Age of Onset  . Hypertension Mother   . Heart disease Maternal Grandfather   . Diverticulitis Maternal Grandmother     colostomy    History  Substance Use Topics  . Smoking status: Former Games developer  . Smokeless tobacco: Never Used  . Alcohol Use: Yes     Social    OBJECTIVE: BP 120/86  Pulse 88  Ht 6\' 1"  (1.854 m)  Wt 268 lb 9.6 oz (121.836 kg)  BMI 35.44 kg/m2 Constitutional: Well-developed and well-nourished. No distress. HEENT: Normocephalic and atraumatic. Oropharynx is clear and moist. No oropharyngeal exudate. Conjunctivae are normal. No scleral icterus. Neck: Neck supple. Trachea midline. Cardiovascular: Normal rate, regular rhythm and intact distal pulses. No M/R/G Pulmonary/chest: Effort normal and breath  sounds normal. No wheezing, rales or rhonchi. Abdominal: Soft, diffuse mild lower tenderness without rebound or guarding, nondistended. Bowel sounds active throughout. There are no masses palpable. No hepatosplenomegaly. Extremities: no clubbing, cyanosis, or edema Lymphadenopathy: No cervical adenopathy noted. Neurological: Alert and oriented to person place and time. Skin: Skin is warm and dry. No rashes noted. Psychiatric: Normal mood and affect. Behavior is normal.  Labs and Imaging -- None.  ASSESSMENT AND PLAN: 26 year old male with a past  medical history of ADHD, GERD and insomnia who seen in consultation at request of Dr. Dayton Martes for evaluation of diarrhea and intermittent abdominal pain.  1. Diarrhea/loose stools with abd pain -- the patient's symptoms seem most consistent with irritable bowel syndrome with diarrhea predominance, however first I would like to exclude other pathologies which could be contributing to his overall symptoms. This is been going on some time making infection less likely, but I would like to perform stool studies for completeness. We will collect stool for C. difficile, ova and parasite, culture, leukocytes, and elastase. Also like to do labs to include CBC, CMP, TSH, and celiac panel. If these tests are unrevealing we will likely proceed to colonoscopy. I will give him a trial of Lomotil to be used as directed 4 times a day when necessary for diarrhea. This can replace loperamide. I also will prescribe Levsin 0.25 mg every 4-6 hours sublingual when necessary abdominal pain/spasm. We briefly discussed inflammatory bowel disease, but at present this is lower on the differential.  Further recommendations after labs have been obtained

## 2011-10-16 ENCOUNTER — Other Ambulatory Visit: Payer: Self-pay | Admitting: Internal Medicine

## 2011-10-20 ENCOUNTER — Ambulatory Visit (HOSPITAL_COMMUNITY)
Admission: RE | Admit: 2011-10-20 | Discharge: 2011-10-20 | Disposition: A | Discharge status: Home / Self Care | Payer: BC Managed Care – PPO | Source: Ambulatory Visit | Attending: Internal Medicine | Admitting: Internal Medicine

## 2011-10-20 DIAGNOSIS — R7989 Other specified abnormal findings of blood chemistry: Secondary | ICD-10-CM | POA: Insufficient documentation

## 2011-10-20 DIAGNOSIS — R109 Unspecified abdominal pain: Secondary | ICD-10-CM | POA: Insufficient documentation

## 2011-11-03 ENCOUNTER — Other Ambulatory Visit: Payer: Self-pay | Admitting: Internal Medicine

## 2011-11-03 ENCOUNTER — Other Ambulatory Visit: Payer: BC Managed Care – PPO

## 2011-11-03 DIAGNOSIS — R197 Diarrhea, unspecified: Secondary | ICD-10-CM

## 2011-11-03 DIAGNOSIS — R109 Unspecified abdominal pain: Secondary | ICD-10-CM

## 2011-11-04 LAB — HEPATITIS C ANTIBODY: HCV Ab: NEGATIVE

## 2011-11-06 ENCOUNTER — Other Ambulatory Visit: Payer: Self-pay

## 2011-11-06 ENCOUNTER — Telehealth: Payer: Self-pay | Admitting: Internal Medicine

## 2011-11-06 LAB — CLOSTRIDIUM DIFFICILE BY PCR: Toxigenic C. Difficile by PCR: NOT DETECTED

## 2011-11-06 NOTE — Telephone Encounter (Signed)
Pt wanted results of his stool cx. The only one back was CDIFF that was negative. Pt wanted a f/u and was scheduled for 11/08/11. Pt reports he has been sober for 5 days!

## 2011-11-06 NOTE — Telephone Encounter (Signed)
Pt request rx Adderall. Call when ready for pick up. 

## 2011-11-07 ENCOUNTER — Encounter: Payer: Self-pay | Admitting: Internal Medicine

## 2011-11-07 LAB — STOOL CULTURE

## 2011-11-07 LAB — OVA AND PARASITE SCREEN: OP: NONE SEEN

## 2011-11-07 MED ORDER — AMPHETAMINE-DEXTROAMPHET ER 20 MG PO CP24: ORAL_CAPSULE | ORAL | Status: DC

## 2011-11-07 NOTE — Telephone Encounter (Signed)
Advised patient script is ready for pick up, script placed at front desk. 

## 2011-11-08 ENCOUNTER — Ambulatory Visit (INDEPENDENT_AMBULATORY_CARE_PROVIDER_SITE_OTHER): Payer: BC Managed Care – PPO | Admitting: Internal Medicine

## 2011-11-08 ENCOUNTER — Encounter: Payer: Self-pay | Admitting: Internal Medicine

## 2011-11-08 VITALS — BP 124/80 | HR 88 | Ht 73.0 in | Wt 265.4 lb

## 2011-11-08 DIAGNOSIS — R1030 Lower abdominal pain, unspecified: Secondary | ICD-10-CM

## 2011-11-08 DIAGNOSIS — R748 Abnormal levels of other serum enzymes: Secondary | ICD-10-CM

## 2011-11-08 DIAGNOSIS — R197 Diarrhea, unspecified: Secondary | ICD-10-CM

## 2011-11-08 DIAGNOSIS — R109 Unspecified abdominal pain: Secondary | ICD-10-CM

## 2011-11-08 DIAGNOSIS — K589 Irritable bowel syndrome without diarrhea: Secondary | ICD-10-CM | POA: Insufficient documentation

## 2011-11-08 MED ORDER — PEG 3350/ELECTROLYTES 240 G PO SOLR: 240.0000 g | Freq: Once | ORAL | Status: DC

## 2011-11-08 NOTE — Progress Notes (Signed)
Subjective:    Patient ID: Russell Kane, male    DOB: 05/28/1985, 26 y.o.   MRN: [id number]  HPI Russell Kane is a 26 yo male with PMH of ADHD, GERD and insomnia who seen in follow-up for diarrhea and intermittent abdominal pain. The patient was last seen in June 2013. He reports he is still having loose stools with less urgency. He is having 4-5 loose stools daily, and occasionally he is using Imodium to her tail his diarrhea, especially when he has work-related events, etc. He did not get a prescription for Lomotil filled. He still not seen bleeding or melena. He is still unaware of any triggers for his diarrhea. He did submit stool studies, which were negative for infection, though this fecal leukocytes positive.  He reports improvement in his lower abdominal pain. Appetite is good. No nausea or vomiting. He was noted to have mild elevation in his serum transaminases and had an abdominal ultrasound suggestive of possibly fatty liver. We advised that he cut alcohol intake, and he has been abstinent entirely for 6 days. He is planning a trial of complete abstinence for 2 months to see if this improves his liver enzyme abnormalities. No fevers or chills.  Review of Systems As per history of present illness, otherwise negative  Current Medications, Allergies, Past Medical History, Past Surgical History, Family History and Social History were reviewed in [name].     Objective:   Physical Exam BP 124/80  Pulse 88  Ht 6\' 1"  (1.854 m)  Wt 265 lb 6.4 oz (120.385 kg)  BMI 35.02 kg/m2 Constitutional: Well-developed and well-nourished. No distress. HEENT: Normocephalic and atraumatic. Oropharynx is clear and moist. No oropharyngeal exudate. Conjunctivae are normal. Pupils are equal round and reactive to light. No scleral icterus. Neck: Neck supple. Trachea midline. Cardiovascular: Normal rate, regular rhythm and intact distal pulses. No M/R/G Pulmonary/chest:  Effort normal and breath sounds normal. No wheezing, rales or rhonchi. Abdominal: Soft, nontender, nondistended. Bowel sounds active throughout. There are no masses palpable. No hepatosplenomegaly. Extremities: no clubbing, cyanosis, or edema Lymphadenopathy: No cervical adenopathy noted. Neurological: Alert and oriented to person place and time. Skin: Skin is warm and dry. No rashes noted. Psychiatric: Normal mood and affect. Behavior is normal.  CBC    Component Value Date/Time   WBC 6.3 10/11/2011 1049   RBC 5.18 10/11/2011 1049   HGB 15.8 10/11/2011 1049   HCT 47.3 10/11/2011 1049   PLT 197.0 10/11/2011 1049   MCV 91.3 10/11/2011 1049   MCHC 33.3 10/11/2011 1049   RDW 12.7 10/11/2011 1049   LYMPHSABS 1.8 10/11/2011 1049   MONOABS 0.5 10/11/2011 1049   EOSABS 0.1 10/11/2011 1049   BASOSABS 0.0 10/11/2011 1049    CMP     Component Value Date/Time   NA 139 10/11/2011 1049   K 4.1 10/11/2011 1049   CL 103 10/11/2011 1049   CO2 25 10/11/2011 1049   GLUCOSE 88 10/11/2011 1049   BUN 19 10/11/2011 1049   CREATININE 1.3 10/11/2011 1049   CALCIUM 9.5 10/11/2011 1049   PROT 7.5 10/11/2011 1049   ALBUMIN 4.1 10/11/2011 1049   AST 48* 10/11/2011 1049   ALT 89* 10/11/2011 1049   ALKPHOS 45 10/11/2011 1049   BILITOT 1.1 10/11/2011 1049    Hep A immune Hep B immune Hep C negative  Celiac panel - neg TSH normal  [place] - July 2013 COMPLETE ABDOMINAL ULTRASOUND  Comparison: None.   Findings:   Gallbladder: No  gallstones, gallbladder wall thickening, or  pericholecystic fluid. Evaluation for a sonographic Murphy's sign  is negative  Common bile duct: Measures 5 mm in diameter and has a normal  appearance  Liver: Demonstrates mild diffuse increase in echotexture. No  signs of intrahepatic ductal dilatation or focal parenchymal  abnormality is seen and the overall appearance suggests the  possibility of underlying hepatic steatosis or hepatocellular  disease.  IVC: The visualized portion appears  normal  Pancreas: Is poorly visualized due to shadowing from overlying gas  Spleen: Upper normal in size with a length of 14.5 cm and volume  of 400.2 cc. (splenomegaly > 411 cc). The overall echotexture is  mildly heterogeneous with no focal abnormality identified.  Right Kidney: Has a sagittal length of 11.6 cm. No focal  parenchymal abnormality or signs of hydronephrosis are seen  Left Kidney: Has a sagittal length of 12.5 cm. No focal  parenchymal abnormality or signs of hydronephrosis is noted.  Incidental note is made of a dromedary hump  Abdominal aorta: Maximal caliber of 2.6 cm with no aneurysmal  dilatation noted   IMPRESSION:  Mild heterogeneity of the liver parenchyma with no focal  abnormality suggesting underlying mild hepatic steatosis or  hepatocellular disease.  Upper normal splenic size with mild heterogeneity of the  parenchymal pattern. No focal abnormality is seen and this is of  questionable significance.  Poorly assessed pancreas     Assessment & Plan:  26 yo male with PMH of ADHD, GERD and insomnia who seen in follow-up for diarrhea and intermittent abdominal pain., also with mild elevation in serum transaminases and possibly fatty liver.  1. Diarrhea, IBS -- at this point the most likely cause of his diarrhea is felt be-year-old bowel syndrome. It does seem to be worse during stressful periods. The fecal leukocytes are not consistent with irritable bowel, and for this reason I recommended colonoscopy to further evaluate his colon and rule out inflammatory bowel disease. He is aware of this recommendation and monitor see. He would however, like to wait to after he is married. His wedding is scheduled for August 24. We will schedule his colonoscopy for early September. He can continue to use the Imodium as needed to curtail his diarrhea. I've asked that should he develop worsening diarrhea, worsening abdominal pain, fevers, or any evidence of bleeding that he contact  us immediately. He voices understanding.  2. Elevated transaminases -- I do think he was drinking alcohol on a consistent fairly heavy basis. I think at this contribute to his elevation in AST and ALT, and perhaps there is a component of NASH.  His BMI is 35, but he does not have a history of hypertension, diabetes, or hypercholesterolemia. I agree completely and have recommended alcohol abstinence. He is committed to this. I would like for him to be off alcohol for one month and then we'll repeat his hepatic function panel. If this remains elevated off alcohol, then we may proceed to liver biopsy. We discussed liver biopsy today he understands that this will be my recommendation if his liver numbers fail to normalize.  We've also discussed the importance of diet and exercise. I would like for him to try to lose 10-20 pounds, and he has recently started eating better. Simply avoiding alcohol, may contribute to weight reduction and certainly should reduce macrosteatosis associated with alcohol intake.  Hepatic function panel the week of August 20, also check ferritin, ANA, and IgG. Colonoscopy early September Followup thereafter

## 2011-11-08 NOTE — Patient Instructions (Addendum)
.  You have been scheduled for a colonoscopy with propofol. Please follow written instructions given to you at your visit today.  Please pick up your prep kit at the pharmacy within the next 1-3 days. If you use inhalers (even only as needed), please bring them with you on the day of your procedure.  Your physician has requested that you go to the basement for lab work on AUGUST 20th.

## 2011-12-04 ENCOUNTER — Other Ambulatory Visit (INDEPENDENT_AMBULATORY_CARE_PROVIDER_SITE_OTHER): Payer: BC Managed Care – PPO

## 2011-12-04 DIAGNOSIS — R1030 Lower abdominal pain, unspecified: Secondary | ICD-10-CM

## 2011-12-04 DIAGNOSIS — R197 Diarrhea, unspecified: Secondary | ICD-10-CM

## 2011-12-04 DIAGNOSIS — R109 Unspecified abdominal pain: Secondary | ICD-10-CM

## 2011-12-04 LAB — COMPREHENSIVE METABOLIC PANEL
ALT: 41 U/L (ref 0–53)
AST: 26 U/L (ref 0–37)
Albumin: 4.4 g/dL (ref 3.5–5.2)
Alkaline Phosphatase: 52 U/L (ref 39–117)
Calcium: 9.4 mg/dL (ref 8.4–10.5)
Chloride: 105 mEq/L (ref 96–112)
Potassium: 4.2 mEq/L (ref 3.5–5.1)
Sodium: 140 mEq/L (ref 135–145)

## 2011-12-04 LAB — HEPATIC FUNCTION PANEL
ALT: 41 U/L (ref 0–53)
Alkaline Phosphatase: 52 U/L (ref 39–117)
Bilirubin, Direct: 0.1 mg/dL (ref 0.0–0.3)
Total Bilirubin: 0.6 mg/dL (ref 0.3–1.2)
Total Protein: 7.4 g/dL (ref 6.0–8.3)

## 2011-12-04 LAB — FERRITIN: Ferritin: 52.7 ng/mL (ref 22.0–322.0)

## 2011-12-07 ENCOUNTER — Other Ambulatory Visit: Payer: Self-pay

## 2011-12-07 MED ORDER — AMPHETAMINE-DEXTROAMPHET ER 20 MG PO CP24: ORAL_CAPSULE | ORAL | Status: DC

## 2011-12-07 NOTE — Telephone Encounter (Signed)
plz notify ready to pick up. 

## 2011-12-07 NOTE — Telephone Encounter (Signed)
Message left notifying patient and Rx placed up front for pick up. 

## 2011-12-07 NOTE — Telephone Encounter (Signed)
Pt request rx Adderall. Call when ready for pick up. Pt only has 1 more capsule and request pick up tomorrow.Please advise.

## 2011-12-22 ENCOUNTER — Telehealth: Payer: Self-pay | Admitting: Internal Medicine

## 2011-12-22 NOTE — Telephone Encounter (Signed)
No charge. 

## 2011-12-25 ENCOUNTER — Encounter: Payer: BC Managed Care – PPO | Admitting: Internal Medicine

## 2012-01-18 ENCOUNTER — Other Ambulatory Visit: Payer: Self-pay

## 2012-01-18 MED ORDER — AMPHETAMINE-DEXTROAMPHET ER 20 MG PO CP24: ORAL_CAPSULE | ORAL | Status: DC

## 2012-01-18 NOTE — Telephone Encounter (Signed)
Left message on voice mail advising patient script is ready for pick up. 

## 2012-01-18 NOTE — Telephone Encounter (Signed)
Pt request adderall rx. Call when ready for pick up.

## 2012-02-02 ENCOUNTER — Other Ambulatory Visit: Payer: Self-pay | Admitting: *Deleted

## 2012-02-02 MED ORDER — ESOMEPRAZOLE MAGNESIUM 40 MG PO CPDR: 40.0000 mg | DELAYED_RELEASE_CAPSULE | Freq: Every day | ORAL | Status: DC

## 2012-02-21 ENCOUNTER — Other Ambulatory Visit: Payer: Self-pay

## 2012-02-21 NOTE — Telephone Encounter (Signed)
Pt request written rx adderall xr. Call when ready for pick up.

## 2012-02-22 MED ORDER — AMPHETAMINE-DEXTROAMPHET ER 20 MG PO CP24: ORAL_CAPSULE | ORAL | Status: DC

## 2012-02-22 NOTE — Telephone Encounter (Signed)
Left message advising pt script is ready for pick.

## 2012-03-08 ENCOUNTER — Telehealth: Payer: Self-pay | Admitting: Pulmonary Disease

## 2012-03-08 MED ORDER — CLONAZEPAM 0.5 MG PO TABS: ORAL_TABLET | ORAL | Status: DC

## 2012-03-08 NOTE — Telephone Encounter (Signed)
rx for the clonazepam has been called to the pharmacy and nothing further is needed.

## 2012-03-29 ENCOUNTER — Other Ambulatory Visit: Payer: Self-pay

## 2012-03-29 MED ORDER — AMPHETAMINE-DEXTROAMPHET ER 20 MG PO CP24: ORAL_CAPSULE | ORAL | Status: DC

## 2012-03-29 NOTE — Telephone Encounter (Signed)
Pt left v/m requesting rx adderall. Call when ready for pick up. 

## 2012-03-29 NOTE — Telephone Encounter (Signed)
Left message on voice mail advising patient script is ready for pick up, placed at front desk.

## 2012-05-16 ENCOUNTER — Other Ambulatory Visit: Payer: Self-pay

## 2012-05-16 MED ORDER — AMPHETAMINE-DEXTROAMPHET ER 20 MG PO CP24: ORAL_CAPSULE | ORAL | Status: DC

## 2012-05-16 NOTE — Telephone Encounter (Signed)
Left message advising patient script is ready for pick up. 

## 2012-05-16 NOTE — Telephone Encounter (Signed)
Pt left v/m requesting rx adderall. Call when ready for pick up. 

## 2012-07-03 ENCOUNTER — Other Ambulatory Visit: Payer: Self-pay | Admitting: Family Medicine

## 2012-07-03 MED ORDER — AMPHETAMINE-DEXTROAMPHET ER 20 MG PO CP24: ORAL_CAPSULE | ORAL | Status: DC

## 2012-07-03 NOTE — Telephone Encounter (Signed)
Pt requesting refill for Adderall.  Please call when ready for pick up.

## 2012-07-03 NOTE — Telephone Encounter (Signed)
Script on your desk for signature.

## 2012-07-04 NOTE — Telephone Encounter (Signed)
Left message advising patient script is ready for pick up. 

## 2012-08-07 ENCOUNTER — Other Ambulatory Visit: Payer: Self-pay

## 2012-08-07 NOTE — Telephone Encounter (Signed)
Pt left v/m requesting rx adderall. Call when ready for pick up. 

## 2012-08-07 NOTE — Telephone Encounter (Signed)
Ok to print out and put on my desk for signature. 

## 2012-08-08 MED ORDER — AMPHETAMINE-DEXTROAMPHET ER 20 MG PO CP24: ORAL_CAPSULE | ORAL | Status: DC

## 2012-08-08 NOTE — Telephone Encounter (Signed)
Advised patient script is ready for pick up, placed at front desk. 

## 2012-09-02 ENCOUNTER — Encounter: Payer: Self-pay | Admitting: Pulmonary Disease

## 2012-09-02 ENCOUNTER — Ambulatory Visit (INDEPENDENT_AMBULATORY_CARE_PROVIDER_SITE_OTHER): Payer: BC Managed Care – PPO | Admitting: Pulmonary Disease

## 2012-09-02 VITALS — BP 152/98 | HR 99 | Temp 98.0°F | Ht 73.0 in | Wt 279.0 lb

## 2012-09-02 DIAGNOSIS — G478 Other sleep disorders: Secondary | ICD-10-CM

## 2012-09-02 DIAGNOSIS — G4710 Hypersomnia, unspecified: Secondary | ICD-10-CM

## 2012-09-02 NOTE — Assessment & Plan Note (Signed)
The patient continues to have issues with poor sleep hygiene and sleep onset issues because of an irregular sleep schedule associated with his job.  I have reviewed with him what constitutes good sleep hygiene, and some of the techniques that may allow him to sleep more consistently.

## 2012-09-02 NOTE — Assessment & Plan Note (Signed)
The patient is doing well on Klonopin, and has not had any further behavioral issues.

## 2012-09-02 NOTE — Patient Instructions (Addendum)
Stay on klonopin for your parasomnias Keep working on significant weight loss Try and maintain as normal of a sleep schedule as possible.  Do not stay in bed for more than if you cannot initiate sleep.  followup with me in one year if doing well.

## 2012-09-02 NOTE — Progress Notes (Signed)
  Subjective:    Patient ID: Russell Kane, male    DOB: November 21, 1985, 27 y.o.   MRN: [id number]  HPI Patient comes in today for followup of his known parasol he is.  He is staying on Klonopin at bedtime, and has not had any further episodes.  He continues to have poor sleep hygiene because of his occupation, and I have asked him to work on this as much as he can.   Review of Systems  Constitutional: Negative for fever and unexpected weight change.  HENT: Negative for ear pain, nosebleeds, congestion, sore throat, rhinorrhea, sneezing, trouble swallowing, dental problem, postnasal drip and sinus pressure.   Eyes: Negative for redness and itching.  Respiratory: Negative for cough, chest tightness, shortness of breath and wheezing.   Cardiovascular: Negative for palpitations and leg swelling.  Gastrointestinal: Negative for nausea and vomiting.  Genitourinary: Negative for dysuria.  Musculoskeletal: Negative for joint swelling.  Skin: Negative for rash.  Neurological: Negative for headaches.  Hematological: Does not bruise/bleed easily.  Psychiatric/Behavioral: Negative for dysphoric mood. The patient is not nervous/anxious.        Objective:   Physical Exam Overweight male in no acute distress Nose without purulence or discharge noted Neck without lymphadenopathy or thyromegaly Lower extremities without edema, cyanosis Alert and oriented, moves all 4 extremities.       Assessment & Plan:

## 2012-09-04 ENCOUNTER — Other Ambulatory Visit: Payer: Self-pay

## 2012-09-04 MED ORDER — AMPHETAMINE-DEXTROAMPHET ER 20 MG PO CP24: ORAL_CAPSULE | ORAL | Status: DC

## 2012-09-04 NOTE — Telephone Encounter (Signed)
Left message advising patient script is ready for pick up. 

## 2012-09-04 NOTE — Telephone Encounter (Signed)
Pt left v/m requesting rx adderall. Call when ready for pick up. 

## 2012-09-05 ENCOUNTER — Encounter: Payer: Self-pay | Admitting: Family Medicine

## 2012-09-11 ENCOUNTER — Telehealth: Payer: Self-pay | Admitting: *Deleted

## 2012-09-11 NOTE — Telephone Encounter (Signed)
Pt's toxicology screen was positive for cocaine, advised him of this.  Patient states theres no way that can be a true result, states he gets random tests at work and cocaine never show up.  He's asking if he can be retested. Please advise.

## 2012-09-11 NOTE — Telephone Encounter (Signed)
Left message asking patient to call back

## 2012-09-11 NOTE — Telephone Encounter (Signed)
Advised patient as instructed. 

## 2012-09-11 NOTE — Telephone Encounter (Signed)
No we cannot retest and I unfortunately cannot prescribe his medication anymore.  He will need to find another provider at another practice if he would like to continue taking Adderall.

## 2012-09-17 ENCOUNTER — Other Ambulatory Visit: Payer: Self-pay | Admitting: Family Medicine

## 2012-09-17 ENCOUNTER — Encounter: Payer: Self-pay | Admitting: Family Medicine

## 2012-09-23 ENCOUNTER — Other Ambulatory Visit: Payer: Self-pay | Admitting: Pulmonary Disease

## 2013-12-03 ENCOUNTER — Emergency Department (HOSPITAL_COMMUNITY): Payer: Non-veteran care

## 2013-12-03 ENCOUNTER — Emergency Department (HOSPITAL_COMMUNITY)
Admission: EM | Admit: 2013-12-03 | Discharge: 2013-12-03 | Disposition: A | Discharge status: Home / Self Care | Payer: Non-veteran care | Attending: Emergency Medicine | Admitting: Emergency Medicine

## 2013-12-03 ENCOUNTER — Encounter (HOSPITAL_COMMUNITY): Payer: Self-pay | Admitting: Emergency Medicine

## 2013-12-03 DIAGNOSIS — Z79899 Other long term (current) drug therapy: Secondary | ICD-10-CM | POA: Insufficient documentation

## 2013-12-03 DIAGNOSIS — IMO0002 Reserved for concepts with insufficient information to code with codable children: Secondary | ICD-10-CM | POA: Insufficient documentation

## 2013-12-03 DIAGNOSIS — R079 Chest pain, unspecified: Secondary | ICD-10-CM | POA: Insufficient documentation

## 2013-12-03 DIAGNOSIS — G478 Other sleep disorders: Secondary | ICD-10-CM | POA: Diagnosis not present

## 2013-12-03 DIAGNOSIS — F988 Other specified behavioral and emotional disorders with onset usually occurring in childhood and adolescence: Secondary | ICD-10-CM | POA: Insufficient documentation

## 2013-12-03 DIAGNOSIS — Z7982 Long term (current) use of aspirin: Secondary | ICD-10-CM | POA: Insufficient documentation

## 2013-12-03 DIAGNOSIS — R0789 Other chest pain: Secondary | ICD-10-CM | POA: Diagnosis not present

## 2013-12-03 DIAGNOSIS — K219 Gastro-esophageal reflux disease without esophagitis: Secondary | ICD-10-CM | POA: Diagnosis not present

## 2013-12-03 DIAGNOSIS — Z880 Allergy status to penicillin: Secondary | ICD-10-CM | POA: Insufficient documentation

## 2013-12-03 DIAGNOSIS — Z87891 Personal history of nicotine dependence: Secondary | ICD-10-CM | POA: Insufficient documentation

## 2013-12-03 DIAGNOSIS — M549 Dorsalgia, unspecified: Secondary | ICD-10-CM | POA: Insufficient documentation

## 2013-12-03 LAB — CBC
HCT: 47.5 % (ref 39.0–52.0)
Hemoglobin: 16.5 g/dL (ref 13.0–17.0)
MCH: 30.5 pg (ref 26.0–34.0)
MCHC: 34.7 g/dL (ref 30.0–36.0)
MCV: 87.8 fL (ref 78.0–100.0)
PLATELETS: 241 10*3/uL (ref 150–400)
RBC: 5.41 MIL/uL (ref 4.22–5.81)
RDW: 12.7 % (ref 11.5–15.5)
WBC: 9.8 10*3/uL (ref 4.0–10.5)

## 2013-12-03 LAB — BASIC METABOLIC PANEL
ANION GAP: 16 — AB (ref 5–15)
BUN: 18 mg/dL (ref 6–23)
CALCIUM: 9.7 mg/dL (ref 8.4–10.5)
CO2: 24 mEq/L (ref 19–32)
CREATININE: 1.24 mg/dL (ref 0.50–1.35)
Chloride: 101 mEq/L (ref 96–112)
GFR calc non Af Amer: 77 mL/min — ABNORMAL LOW (ref >90)
GFR, EST AFRICAN AMERICAN: 89 mL/min — AB (ref >90)
Glucose, Bld: 93 mg/dL (ref 70–99)
Potassium: 4 mEq/L (ref 3.7–5.3)
SODIUM: 141 meq/L (ref 137–147)

## 2013-12-03 LAB — I-STAT TROPONIN, ED
Troponin i, poc: 0 ng/mL (ref 0.00–0.08)
Troponin i, poc: 0 ng/mL (ref 0.00–0.08)

## 2013-12-03 MED ORDER — GI COCKTAIL ~~LOC~~
30.0000 mL | Freq: Once | ORAL | Status: AC
  Administered 2013-12-03: 30 mL via ORAL
  Filled 2013-12-03: qty 30

## 2013-12-03 MED ORDER — IOHEXOL 350 MG/ML SOLN
80.0000 mL | Freq: Once | INTRAVENOUS | Status: AC | PRN
  Administered 2013-12-03: 60 mL via INTRAVENOUS

## 2013-12-03 MED ORDER — MORPHINE SULFATE 4 MG/ML IJ SOLN
4.0000 mg | Freq: Once | INTRAMUSCULAR | Status: AC
  Administered 2013-12-03: 4 mg via INTRAVENOUS
  Filled 2013-12-03: qty 1

## 2013-12-03 MED ORDER — NAPROXEN 500 MG PO TABS: 500.0000 mg | ORAL_TABLET | Freq: Two times a day (BID) | ORAL | Status: DC | PRN

## 2013-12-03 MED ORDER — OXYCODONE-ACETAMINOPHEN 5-325 MG PO TABS: 1.0000 | ORAL_TABLET | Freq: Four times a day (QID) | ORAL | Status: DC | PRN

## 2013-12-03 NOTE — ED Notes (Signed)
Pt states started having chest pain yesterday and stated it felt like indigestion.  Today chest pain radiated to back.  Pt reports nausea today.  [facility]/99 and HR 126.

## 2013-12-03 NOTE — ED Notes (Signed)
Patient transported to CT 

## 2013-12-03 NOTE — ED Notes (Signed)
PA at bedside.

## 2013-12-03 NOTE — Discharge Instructions (Signed)
Your chest pain was not cardiac in nature, and a full work up did not reveal a source for this pain. Try using the pain medications prescribed (percocet and naprosyn), as needed for pain, and try heat over the affected area to see if this provides relief. Avoid any aggravating foods, and use your home reflux medications to help any abdominal discomfort. Return to the ER if anything changes or worsens. See your primary doctor in 1 week for re-evaluation.   Chest Pain (Nonspecific) It is often hard to give a diagnosis for the cause of chest pain. There is always a chance that your pain could be related to something serious, such as a heart attack or a blood clot in the lungs. You need to follow up with your doctor. HOME CARE  If antibiotic medicine was given, take it as directed by your doctor. Finish the medicine even if you start to feel better.  For the next few days, avoid activities that bring on chest pain. Continue physical activities as told by your doctor.  Do not use any tobacco products. This includes cigarettes, chewing tobacco, and e-cigarettes.  Avoid drinking alcohol.  Only take medicine as told by your doctor.  Follow your doctor's suggestions for more testing if your chest pain does not go away.  Keep all doctor visits you made. GET HELP IF:  Your chest pain does not go away, even after treatment.  You have a rash with blisters on your chest.  You have a fever. GET HELP RIGHT AWAY IF:   You have more pain or pain that spreads to your arm, neck, jaw, back, or belly (abdomen).  You have shortness of breath.  You cough more than usual or cough up blood.  You have very bad back or belly pain.  You feel sick to your stomach (nauseous) or throw up (vomit).  You have very bad weakness.  You pass out (faint).  You have chills. This is an emergency. Do not wait to see if the problems will go away. Call your local emergency services (911 in [place] Do not drive  yourself to the hospital. MAKE SURE YOU:   Understand these instructions.  Will watch your condition.  Will get help right away if you are not doing well or get worse. Document Released: 09/20/2007 Document Revised: 04/08/2013 Document Reviewed: 09/20/2007 [facility] Patient Information 2015 [place], [place]. This information is not intended to replace advice given to you by your health care provider. Make sure you discuss any questions you have with your health care provider.

## 2013-12-03 NOTE — ED Provider Notes (Signed)
CSN: [id number]     Arrival date & time 12/03/13  1310 History   First MD Initiated Contact with Patient 12/03/13 1528     Chief Complaint  Patient presents with  . Chest Pain     (Consider location/radiation/quality/duration/timing/severity/associated sxs/prior Treatment) HPI Comments: Russell Kane is a 28 y.o. Male with a PMHx of GERD and ADHD/ADD, who presents today with complaints of substernal chest pain that began yesterday morning and has increased this morning. He states that the pain is sharp, 6/10, centrally located, radiating to his mid to upper back, constant, worse with standing and breathing out, with no alleviating factors. He states that he tried 486 mg of aspirin with no change in his pain. He states that this chest pain began while he was at work walking around. He states that the pain never subsided. He tried Tum's and rest with no relief. He states that he tried his Clonopin last night which he takes for sleep, and this did not seem to alleviate his pain but did help him sleep. States he took his BP and HR at [facility], and it was 160/90s and 120s respectively, which prompted him to come to the ED. Had some nausea earlier which subsided. He denies any radiation to his jaw or arm, diaphoresis, paresthesias, weakness, shortness of breath, DOE, pleuritic chest pain, abdominal pain, vomiting, diarrhea, constipation, dysuria, myalgias, or arthralgias. He states that he has not had any cough recently. He denies any prior history of hypertension or DVTs, but does have a strong family history of hypertension. Denies illicit drug use. Takes adderall 20mg  instant release daily at 7am, which did not seem to change his pain.   Patient is a 28 y.o. male presenting with chest pain. The history is provided by the patient. No language interpreter was used.  Chest Pain Pain location:  Substernal area Pain quality: sharp   Pain radiates to:  Upper back Pain radiates to the back: yes   Pain severity:   Moderate Onset quality:  Sudden Duration:  36 hours Timing:  Constant Progression:  Unchanged Chronicity:  New Context: movement   Context: not breathing, no drug use, not eating, not lifting, not raising an arm, not at rest and no trauma   Relieved by:  Nothing Worsened by:  Certain positions and movement (breathing out, standing) Ineffective treatments:  Antacids, aspirin and rest (486mg  ASA, Tums, Omeprazole, rest) Associated symptoms: back pain and nausea (resolved now)   Associated symptoms: no abdominal pain, no anxiety, no claudication, no cough, no diaphoresis, no dizziness, no dysphagia, no fever, no headache, no heartburn, no lower extremity edema, no near-syncope, no numbness, no orthopnea, no palpitations, no PND, no shortness of breath, no syncope, not vomiting and no weakness   Risk factors: male sex and smoking (former)   Risk factors: no coronary artery disease, no diabetes mellitus, no hypertension, no prior DVT/PE and no surgery     Past Medical History  Diagnosis Date  . ETOH abuse   . Parasomnia   . Hypersomnia   . Allergic rhinitis   . GERD (gastroesophageal reflux disease)   . ADD (attention deficit disorder)    Past Surgical History  Procedure Laterality Date  . Dislocated shoulder  2004 and 2006    Left  . Tonsillectomy    . Wisdom tooth extraction     Family History  Problem Relation Age of Onset  . Hypertension Mother   . Heart disease Maternal Grandfather   . Diverticulitis Maternal Grandmother  colostomy   History  Substance Use Topics  . Smoking status: Former Games developer  . Smokeless tobacco: Never Used  . Alcohol Use: Yes     Comment: Social    Review of Systems  Constitutional: Negative for fever and diaphoresis.  HENT: Negative for congestion and trouble swallowing.   Respiratory: Negative for cough, chest tightness and shortness of breath.   Cardiovascular: Positive for chest pain. Negative for palpitations, orthopnea, claudication,  leg swelling, syncope, PND and near-syncope.  Gastrointestinal: Positive for nausea (resolved now). Negative for heartburn, vomiting, abdominal pain, diarrhea and constipation.  Genitourinary: Negative for dysuria and hematuria.  Musculoskeletal: Positive for back pain. Negative for arthralgias, myalgias, neck pain and neck stiffness.  Skin: Negative for color change.  Neurological: Negative for dizziness, weakness, numbness and headaches.  Psychiatric/Behavioral: Negative for confusion.  10 Systems reviewed and are negative for acute change except as noted in the HPI.     Allergies  Penicillins  Home Medications   Prior to Admission medications   Medication Sig Start Date End Date Taking? Authorizing Provider  amphetamine-dextroamphetamine (ADDERALL) 20 MG tablet Take 20 mg by mouth daily.   Yes Historical Provider, MD  aspirin EC 81 MG tablet Take 486 mg by mouth once.   Yes Historical Provider, MD  clonazePAM (KLONOPIN) 0.5 MG tablet TAKE 1-2 TABLETS AT BEDTIME 09/23/12  Yes Barbaraann Share, MD  esomeprazole (NEXIUM) 40 MG capsule Take 40 mg by mouth daily.  02/02/12  Yes Dianne Dun, MD  loratadine (CLARITIN) 10 MG tablet Take 10 mg by mouth daily.   Yes Historical Provider, MD  naproxen (NAPROSYN) 500 MG tablet Take 1 tablet (500 mg total) by mouth 2 (two) times daily as needed for mild pain, moderate pain or headache (TAKE WITH MEALS.). 12/03/13   Syona Wroblewski Strupp Camprubi-Soms, PA-C  oxyCODONE-acetaminophen (PERCOCET) 5-325 MG per tablet Take 1-2 tablets by mouth every 6 (six) hours as needed for severe pain. 12/03/13   Neysha Criado Strupp Camprubi-Soms, PA-C   BP 122/87  Pulse 88  Temp(Src) 98 F (36.7 C) (Oral)  Resp 12  SpO2 98% Physical Exam  Nursing note and vitals reviewed. Constitutional: He is oriented to person, place, and time. Vital signs are normal. He appears well-developed and well-nourished.  Non-toxic appearance. No distress.  Afebrile, nontoxic, NAD  HENT:  Head:  Normocephalic and atraumatic.  Mouth/Throat: Mucous membranes are normal.  Eyes: Conjunctivae and EOM are normal. Right eye exhibits no discharge. Left eye exhibits no discharge.  Neck: Normal range of motion. Neck supple.  Cardiovascular: Normal rate, regular rhythm, normal heart sounds and intact distal pulses.  Exam reveals no gallop and no friction rub.   No murmur heard. RRR, nl s1/s2, no m/r/g, distal pulses equal in all 4 extremities  Pulmonary/Chest: Effort normal and breath sounds normal. No respiratory distress. He has no decreased breath sounds. He has no wheezes. He has no rhonchi. He has no rales. He exhibits no tenderness, no crepitus, no deformity and no retraction.  CTAB in all lung fields, no w/r/r. Chest wall nonTTP, does not reproduce pain. No retractions, crepitus, or deformity  Abdominal: Soft. Normal appearance and bowel sounds are normal. He exhibits no distension. There is no tenderness. There is no rigidity, no rebound, no guarding and negative Murphy's sign.  Soft, NT/ND, +BS throughout, no r/g/r, neg murphy's  Musculoskeletal: Normal range of motion.  Moving all extremities with ease  Neurological: He is alert and oriented to person, place, and time. He has normal  strength. No sensory deficit.  Skin: Skin is warm, dry and intact. No rash noted.  Psychiatric: He has a normal mood and affect.    ED Course  Procedures (including critical care time) Labs Review Labs Reviewed  BASIC METABOLIC PANEL - Abnormal; Notable for the following:    GFR calc non Af Amer 77 (*)    GFR calc Af Amer 89 (*)    Anion gap 16 (*)    All other components within normal limits  CBC  I-STAT TROPOININ, ED  I-STAT TROPOININ, ED    Imaging Review Dg Chest 2 View  12/03/2013   CLINICAL DATA:  Chest pain radiating to the back; indigestion symptoms and nausea  EXAM: CHEST  2 VIEW  COMPARISON:  None.  FINDINGS: The lungs are adequately inflated and clear. The heart and pulmonary  vascularity are normal. The mediastinum is normal in width. There is no pleural effusion or pneumothorax. The bony thorax is unremarkable. The gas pattern in the visualized portions of the upper abdomen is normal.  IMPRESSION: There is no evidence of CHF nor other acute cardiopulmonary abnormality.   Electronically Signed   By: David  [place]   On: 12/03/2013 14:09   Ct Angio Chest Pe W/cm &/or Wo Cm  12/03/2013   CLINICAL DATA:  Chest pain radiating to the back.  EXAM: CT ANGIOGRAPHY CHEST WITH CONTRAST  TECHNIQUE: Multidetector CT imaging of the chest was performed using the standard protocol during bolus administration of intravenous contrast. Multiplanar CT image reconstructions and MIPs were obtained to evaluate the vascular anatomy.  CONTRAST:  60mL OMNIPAQUE IOHEXOL 350 MG/ML SOLN  COMPARISON:  None.  FINDINGS: Image quality at the lung bases is degraded by respiratory motion. No definite pulmonary embolus. No pathologically enlarged mediastinal, hilar or axillary lymph nodes. Heart is at the upper limits of normal in size. No pericardial effusion.  Probable 4 mm subpleural lymph node in the right middle lobe. Minimal dependent atelectasis bilaterally. No pleural fluid. Mild dependent debris in the trachea. Airway is otherwise unremarkable.  Incidental imaging of the upper abdomen shows the visualized portions of the liver, gallbladder, adrenal glands, kidneys, spleen, pancreas, stomach and bowel to be grossly unremarkable. No upper abdominal adenopathy. No worrisome lytic or sclerotic lesions.  Review of the MIP images confirms the above findings.  IMPRESSION: 1. Image quality in the lung bases is degraded by respiratory motion. No definite pulmonary embolus. 2. No other findings to explain the patient's pain.   Electronically Signed   By: Leanna Battles M.D.   On: 12/03/2013 17:55     EKG Interpretation   Date/Time:  Wednesday December 03 2013 13:13:13 EDT Ventricular Rate:  109 PR Interval:   150 QRS Duration: 94 QT Interval:  346 QTC Calculation: 465 R Axis:   126 Text Interpretation:  Sinus tachycardia Left posterior fascicular block  Possible Inferior infarct , age undetermined Cannot rule out Anterior  infarct , age undetermined Abnormal ECG Confirmed by ALLEN  MD, ANTHONY  ([id number]) on 12/03/2013 3:57:26 PM      MDM   Final diagnoses:  Atypical chest pain    28y/o with atypical nonreproducible CP ongoing x1 day. EKG showing sinus tachy, although remaining VSS throughout ED stay. Trop neg, CBC and BMP neg. CXR neg. Will obtain CTA to r/o dissection or PE. Does not sound like anginal type CP. Will give morphine now, and reassess.   6:01 PM CTA neg. Morphine helped with pt's pain prior to CTA. Will repeat troponin  and give GI cocktail now and reassess.   7:24 PM Second troponin neg, doubt ACS. Pain improved with Morphine, then returned and was unchanged with GI cocktail.  Will d/c with percocet and naprosyn, apply heat to area, and watch for improvement. Discussed f/up with his PCP at the [facility] as soon as he can get in, preferably this week for check up after d/c. VSS throughout ED stay. I explained the diagnosis and have given explicit precautions to return to the ER including for any other new or worsening symptoms. The patient understands and accepts the medical plan as it's been dictated and I have answered their questions. Discharge instructions concerning home care and prescriptions have been given. The patient is STABLE and is discharged to home in good condition.  BP 131/82  Pulse 102  Temp(Src) 98 F (36.7 C) (Oral)  Resp 22  SpO2 97%  Meds ordered this encounter  Medications  . morphine 4 MG/ML injection 4 mg    Sig:   . iohexol (OMNIPAQUE) 350 MG/ML injection 80 mL    Sig:   . gi cocktail (Maalox,Lidocaine,Donnatal)    Sig:   . naproxen (NAPROSYN) 500 MG tablet    Sig: Take 1 tablet (500 mg total) by mouth 2 (two) times daily as needed for mild pain,  moderate pain or headache (TAKE WITH MEALS.).    Dispense:  20 tablet    Refill:  0    Order Specific Question:  Supervising Provider    Answer:  Eber Hong D [[id number]]  . oxyCODONE-acetaminophen (PERCOCET) 5-325 MG per tablet    Sig: Take 1-2 tablets by mouth every 6 (six) hours as needed for severe pain.    Dispense:  6 tablet    Refill:  0    Order Specific Question:  Supervising Provider    Answer:  Vida Roller [address] Camprubi-Soms, PA-C 12/03/13 1930

## 2013-12-03 NOTE — ED Provider Notes (Signed)
Medical screening examination/treatment/procedure(s) were conducted as a shared visit with non-physician practitioner(s) and myself.  I personally evaluated the patient during the encounter.   EKG Interpretation   Date/Time:  Wednesday December 03 2013 13:13:13 EDT Ventricular Rate:  109 PR Interval:  150 QRS Duration: 94 QT Interval:  346 QTC Calculation: 465 R Axis:   126 Text Interpretation:  Sinus tachycardia Left posterior fascicular block  Possible Inferior infarct , age undetermined Cannot rule out Anterior  infarct , age undetermined Abnormal ECG Confirmed by Kalyb Pemble  MD, Daijon Wenke  ([id number]) on 12/03/2013 3:57:26 PM     Patient here with persistent chest pain since yesterday which began at rest. No anginal type symptoms to it. Some pleuritic component. Will obtain chest CT and follow  Toy BakerAnthony T Davona Kinoshita, MD 12/03/13 1610

## 2013-12-04 NOTE — ED Provider Notes (Signed)
Medical screening examination/treatment/procedure(s) were performed by non-physician practitioner and as supervising physician I was immediately available for consultation/collaboration.   Toy BakerAnthony T Rubye Strohmeyer, MD 12/04/13 2300

## 2014-08-10 ENCOUNTER — Emergency Department
Admit: 2014-08-10 | Disposition: A | Discharge status: Home / Self Care | Payer: Self-pay | Admitting: Emergency Medicine

## 2015-11-11 ENCOUNTER — Emergency Department
Admission: EM | Admit: 2015-11-11 | Discharge: 2015-11-12 | Disposition: A | Discharge status: Home / Self Care | Payer: PRIVATE HEALTH INSURANCE | Attending: Emergency Medicine | Admitting: Emergency Medicine

## 2015-11-11 DIAGNOSIS — T782XXA Anaphylactic shock, unspecified, initial encounter: Secondary | ICD-10-CM | POA: Insufficient documentation

## 2015-11-11 DIAGNOSIS — Z87891 Personal history of nicotine dependence: Secondary | ICD-10-CM | POA: Insufficient documentation

## 2015-11-11 DIAGNOSIS — Z7982 Long term (current) use of aspirin: Secondary | ICD-10-CM | POA: Insufficient documentation

## 2015-11-11 DIAGNOSIS — Z8719 Personal history of other diseases of the digestive system: Secondary | ICD-10-CM | POA: Insufficient documentation

## 2015-11-11 DIAGNOSIS — Z79899 Other long term (current) drug therapy: Secondary | ICD-10-CM | POA: Insufficient documentation

## 2015-11-11 NOTE — ED Triage Notes (Addendum)
Patient ambulatory to triage with steady gait, without difficulty or distress noted; pt reports swelling to lower lip, sudden onset PTA, no known cause; 2 benadryl taken hr PTA; denies any itching or rashes, no diff swallowing or breathing

## 2015-11-12 MED ORDER — DIPHENHYDRAMINE HCL 50 MG/ML IJ SOLN
INTRAMUSCULAR | Status: AC
  Filled 2015-11-12: qty 1

## 2015-11-12 MED ORDER — EPINEPHRINE 0.3 MG/0.3ML IJ SOAJ
0.3000 mg | Freq: Once | INTRAMUSCULAR | Status: AC
  Administered 2015-11-12: 0.3 mg via INTRAMUSCULAR

## 2015-11-12 MED ORDER — FAMOTIDINE IN NACL 20-0.9 MG/50ML-% IV SOLN
INTRAVENOUS | Status: AC
  Administered 2015-11-12: 20 mg via INTRAVENOUS
  Filled 2015-11-12: qty 50

## 2015-11-12 MED ORDER — METHYLPREDNISOLONE SODIUM SUCC 125 MG IJ SOLR
INTRAMUSCULAR | Status: AC
  Administered 2015-11-12: 125 mg via INTRAVENOUS
  Filled 2015-11-12: qty 2

## 2015-11-12 MED ORDER — PREDNISONE 20 MG PO TABS
40.0000 mg | ORAL_TABLET | Freq: Every day | ORAL | 0 refills | Status: AC
Start: 2015-11-12 — End: 2015-11-17

## 2015-11-12 MED ORDER — METHYLPREDNISOLONE SODIUM SUCC 125 MG IJ SOLR
125.0000 mg | Freq: Once | INTRAMUSCULAR | Status: AC
  Administered 2015-11-12: 125 mg via INTRAVENOUS

## 2015-11-12 MED ORDER — FAMOTIDINE IN NACL 20-0.9 MG/50ML-% IV SOLN
20.0000 mg | Freq: Once | INTRAVENOUS | Status: AC
  Administered 2015-11-12: 20 mg via INTRAVENOUS

## 2015-11-12 MED ORDER — EPINEPHRINE 0.3 MG/0.3ML IJ SOAJ: 0.3000 mg | Freq: Once | INTRAMUSCULAR | 0 refills | Status: AC

## 2015-11-12 MED ORDER — EPINEPHRINE 0.3 MG/0.3ML IJ SOAJ
INTRAMUSCULAR | Status: AC
  Administered 2015-11-12: 0.3 mg via INTRAMUSCULAR
  Filled 2015-11-12: qty 0.3

## 2015-11-12 NOTE — ED Provider Notes (Signed)
[facility] Emergency Department Provider Note  ____________________________________________   First MD Initiated Contact with Patient 11/12/15 0000     (approximate)  I have reviewed the triage vital signs and the nursing notes.   HISTORY  Chief Complaint Allergic Reaction    HPI Russell Kane is a 30 y.o. male presents with acute onset of lip tongue and facial swelling at 9 PM tonight accompanied by difficulty swallowing and breathing. Patient denies any previous history of allergic reactions. Patient denies eating anything unusual tonight or any new medications. Patient denies any rash or pruritus.   Past Medical History:  Diagnosis Date  . ADD (attention deficit disorder)   . Allergic rhinitis   . ETOH abuse   . GERD (gastroesophageal reflux disease)   . Hypersomnia   . Parasomnia     Patient Active Problem List   Diagnosis Date Noted  . IBS (irritable bowel syndrome) 11/08/2011  . Cerumen impaction 01/05/2011  . URI 07/07/2009  . OTITIS EXTERNA, ACUTE 01/09/2008  . HEMATEMESIS 07/17/2007  . PARASOMNIA 04/29/2007  . HYPERSOMNIA UNSPECIFIED 03/01/2007  . ALLERGIC RHINITIS 01/11/2007  . SHOULDER DISLOCATION, RECURRENT 01/11/2007  . ADD 11/21/2006  . CARPAL TUNNEL SYNDROME, BILATERAL 11/21/2006  . GERD 11/21/2006    Past Surgical History:  Procedure Laterality Date  . dislocated shoulder  2004 and 2006   Left  . TONSILLECTOMY    . WISDOM TOOTH EXTRACTION      Prior to Admission medications   Medication Sig Start Date End Date Taking? Authorizing Provider  amphetamine-dextroamphetamine (ADDERALL) 20 MG tablet Take 20 mg by mouth daily.    Historical Provider, MD  aspirin EC 81 MG tablet Take 486 mg by mouth once.    Historical Provider, MD  clonazePAM (KLONOPIN) 0.5 MG tablet TAKE 1-2 TABLETS AT BEDTIME 09/23/12   Barbaraann Share, MD  esomeprazole (NEXIUM) 40 MG capsule Take 40 mg by mouth daily.  02/02/12   Dianne Dun, MD    loratadine (CLARITIN) 10 MG tablet Take 10 mg by mouth daily.    Historical Provider, MD  naproxen (NAPROSYN) 500 MG tablet Take 1 tablet (500 mg total) by mouth 2 (two) times daily as needed for mild pain, moderate pain or headache (TAKE WITH MEALS.). 12/03/13   Mercedes Camprubi-Soms, PA-C  oxyCODONE-acetaminophen (PERCOCET) 5-325 MG per tablet Take 1-2 tablets by mouth every 6 (six) hours as needed for severe pain. 12/03/13   Mercedes Camprubi-Soms, PA-C    Allergies Penicillins  Family History  Problem Relation Age of Onset  . Hypertension Mother   . Heart disease Maternal Grandfather   . Diverticulitis Maternal Grandmother     colostomy    Social History Social History  Substance Use Topics  . Smoking status: Former Games developer  . Smokeless tobacco: Never Used  . Alcohol use Yes     Comment: Social    Review of Systems Constitutional: No fever/chills Eyes: No visual changes. ENT: No sore throat.Positive for lip/tongue Swelling Cardiovascular: Denies chest pain. Respiratory: Denies shortness of breath. Gastrointestinal: No abdominal pain.  No nausea, no vomiting.  No diarrhea.  No constipation. Genitourinary: Negative for dysuria. Musculoskeletal: Negative for back pain. Skin: Negative for rash. Neurological: Negative for headaches, focal weakness or numbness.  10-point ROS otherwise negative.  ____________________________________________   PHYSICAL EXAM:  VITAL SIGNS: ED Triage Vitals [11/11/15 2303]  Enc Vitals Group     BP      Pulse      Resp  Temp      Temp src      SpO2      Weight 275 lb (124.7 kg)     Height  (1.854 m)     Head Circumference      Peak Flow      Pain Score      Pain Loc      Pain Edu?      Excl. in GC?     Constitutional: Alert and oriented. Well appearing and in no acute distress. Eyes: Conjunctivae are normal. PERRL. EOMI. Head: Atraumatic. Ears:  Healthy appearing ear canals and TMs bilaterally Nose: No  congestion/rhinnorhea. Mouth/Throat: Mucous membranes are moist.  Oropharynx non-erythematous. Positive lower lip Swelling Neck: No stridor.  No meningeal signs.  Cardiovascular: Normal rate, regular rhythm. Good peripheral circulation. Grossly normal heart sounds.   Respiratory: Normal respiratory effort.  No retractions. Lungs CTAB. Gastrointestinal: Soft and nontender. No distention.  Musculoskeletal: No lower extremity tenderness nor edema. No gross deformities of extremities. Neurologic:  Normal speech and language. No gross focal neurologic deficits are appreciated.  Skin:  Skin is warm, dry and intact. No rash noted. Psychiatric: Mood and affect are normal. Speech and behavior are normal.**}    RADIOLOGY I, [place] N BROWN, personally viewed and evaluated these images (plain radiographs) as part of my medical decision making, as well as reviewing the written report by the radiologist.  No results found.    Procedures  Critical care: CRITICAL CARE Performed by: Darci Current   Total critical care time: 30 minutes  Critical care time was exclusive of separately billable procedures and treating other patients.  Critical care was necessary to treat or prevent imminent or life-threatening deterioration.  Critical care was time spent personally by me on the following activities: development of treatment plan with patient and/or surrogate as well as nursing, discussions with consultants, evaluation of patient's response to treatment, examination of patient, obtaining history from patient or surrogate, ordering and performing treatments and interventions, ordering and review of laboratory studies, ordering and review of radiographic studies, pulse oximetry and re-evaluation of patient's condition.  ____________________________________________   INITIAL IMPRESSION / ASSESSMENT AND PLAN / ED COURSE  Pertinent labs & imaging results that were available during my care of the  patient were reviewed by me and considered in my medical decision making (see chart for details).  Patient given 0.3 mg IM epinephrine, 125 mg Solu-Medrol Pepcid 20 mg of resolution of swelling. Patient denies any difficulty breathing or swallowing. Patient prescribed prednisone and EpiPen for home. Patient referred to Dr. Elenore Rota  Clinical Course    ____________________________________________  FINAL CLINICAL IMPRESSION(S) / ED DIAGNOSES  Final diagnoses:  Anaphylaxis, initial encounter     MEDICATIONS GIVEN DURING THIS VISIT:  Medications  methylPREDNISolone sodium succinate (SOLU-MEDROL) 125 mg/2 mL injection (not administered)  diphenhydrAMINE (BENADRYL) 50 MG/ML injection (not administered)  EPINEPHrine (EPI-PEN) 0.3 mg/0.3 mL injection (not administered)  famotidine (PEPCID) 20-0.9 MG/50ML-% IVPB (not administered)     NEW OUTPATIENT MEDICATIONS STARTED DURING THIS VISIT:  New Prescriptions   No medications on file      Note:  This document was prepared using Dragon voice recognition software and may include unintentional dictation errors.   Darci Current, MD 11/12/15 [phone]

## 2015-11-12 NOTE — ED Notes (Signed)
ED tech performing vs; pt reports increased swelling to lip with sensation of difficulty swallowing; acuity changed, charge nurse notified and bed assigned for further evaluation by MD

## 2017-01-24 ENCOUNTER — Observation Stay
Admission: EM | Admit: 2017-01-24 | Discharge: 2017-01-25 | Disposition: A | Discharge status: Home / Self Care | Payer: BLUE CROSS/BLUE SHIELD | Attending: General Surgery | Admitting: General Surgery

## 2017-01-24 ENCOUNTER — Encounter
Admission: EM | Disposition: A | Discharge status: Home / Self Care | Payer: Self-pay | Source: Home / Self Care | Attending: Student in an Organized Health Care Education/Training Program

## 2017-01-24 ENCOUNTER — Emergency Department: Payer: BLUE CROSS/BLUE SHIELD

## 2017-01-24 ENCOUNTER — Emergency Department: Payer: BLUE CROSS/BLUE SHIELD | Admitting: Anesthesiology

## 2017-01-24 DIAGNOSIS — E669 Obesity, unspecified: Secondary | ICD-10-CM | POA: Insufficient documentation

## 2017-01-24 DIAGNOSIS — Z880 Allergy status to penicillin: Secondary | ICD-10-CM | POA: Diagnosis not present

## 2017-01-24 DIAGNOSIS — K37 Unspecified appendicitis: Secondary | ICD-10-CM | POA: Diagnosis present

## 2017-01-24 DIAGNOSIS — Z79899 Other long term (current) drug therapy: Secondary | ICD-10-CM | POA: Diagnosis not present

## 2017-01-24 DIAGNOSIS — K3580 Unspecified acute appendicitis: Secondary | ICD-10-CM | POA: Diagnosis not present

## 2017-01-24 DIAGNOSIS — F988 Other specified behavioral and emotional disorders with onset usually occurring in childhood and adolescence: Secondary | ICD-10-CM | POA: Diagnosis not present

## 2017-01-24 DIAGNOSIS — Z7982 Long term (current) use of aspirin: Secondary | ICD-10-CM | POA: Insufficient documentation

## 2017-01-24 DIAGNOSIS — Z23 Encounter for immunization: Secondary | ICD-10-CM | POA: Insufficient documentation

## 2017-01-24 DIAGNOSIS — K219 Gastro-esophageal reflux disease without esophagitis: Secondary | ICD-10-CM | POA: Insufficient documentation

## 2017-01-24 DIAGNOSIS — K589 Irritable bowel syndrome without diarrhea: Secondary | ICD-10-CM | POA: Insufficient documentation

## 2017-01-24 DIAGNOSIS — Z87891 Personal history of nicotine dependence: Secondary | ICD-10-CM | POA: Diagnosis not present

## 2017-01-24 DIAGNOSIS — K3533 Acute appendicitis with perforation and localized peritonitis, with abscess: Secondary | ICD-10-CM | POA: Diagnosis not present

## 2017-01-24 DIAGNOSIS — Z6837 Body mass index (BMI) 37.0-37.9, adult: Secondary | ICD-10-CM | POA: Diagnosis not present

## 2017-01-24 HISTORY — PX: LAPAROSCOPIC APPENDECTOMY: SHX408

## 2017-01-24 LAB — URINALYSIS, COMPLETE (UACMP) WITH MICROSCOPIC
Bilirubin Urine: NEGATIVE
Glucose, UA: NEGATIVE mg/dL
HGB URINE DIPSTICK: NEGATIVE
Ketones, ur: NEGATIVE mg/dL
Leukocytes, UA: NEGATIVE
NITRITE: NEGATIVE
Protein, ur: NEGATIVE mg/dL
SPECIFIC GRAVITY, URINE: 1.018 (ref 1.005–1.030)
Squamous Epithelial / LPF: NONE SEEN
pH: 7 (ref 5.0–8.0)

## 2017-01-24 LAB — COMPREHENSIVE METABOLIC PANEL
ALT: 89 U/L — AB (ref 17–63)
AST: 57 U/L — ABNORMAL HIGH (ref 15–41)
Albumin: 4.4 g/dL (ref 3.5–5.0)
Alkaline Phosphatase: 48 U/L (ref 38–126)
Anion gap: 9 (ref 5–15)
BUN: 15 mg/dL (ref 6–20)
CHLORIDE: 100 mmol/L — AB (ref 101–111)
CO2: 27 mmol/L (ref 22–32)
CREATININE: 1.28 mg/dL — AB (ref 0.61–1.24)
Calcium: 9.2 mg/dL (ref 8.9–10.3)
GFR calc Af Amer: >60 mL/min (ref >60)
Glucose, Bld: 107 mg/dL — ABNORMAL HIGH (ref 65–99)
Potassium: 3.9 mmol/L (ref 3.5–5.1)
Sodium: 136 mmol/L (ref 135–145)
TOTAL PROTEIN: 8.4 g/dL — AB (ref 6.5–8.1)
Total Bilirubin: 1.4 mg/dL — ABNORMAL HIGH (ref 0.3–1.2)

## 2017-01-24 LAB — LIPASE, BLOOD: Lipase: 21 U/L (ref 11–51)

## 2017-01-24 LAB — CBC
HEMATOCRIT: 46.5 % (ref 40.0–52.0)
Hemoglobin: 16.3 g/dL (ref 13.0–18.0)
MCH: 30.8 pg (ref 26.0–34.0)
MCHC: 35 g/dL (ref 32.0–36.0)
MCV: 87.9 fL (ref 80.0–100.0)
PLATELETS: 233 10*3/uL (ref 150–440)
RBC: 5.29 MIL/uL (ref 4.40–5.90)
RDW: 13.4 % (ref 11.5–14.5)
WBC: 13.9 10*3/uL — ABNORMAL HIGH (ref 3.8–10.6)

## 2017-01-24 SURGERY — APPENDECTOMY, LAPAROSCOPIC
Anesthesia: General | Site: Abdomen | Wound class: Clean Contaminated

## 2017-01-24 MED ORDER — OXYCODONE HCL 5 MG PO TABS: 5.0000 mg | ORAL_TABLET | Freq: Once | ORAL | Status: DC | PRN

## 2017-01-24 MED ORDER — PROPOFOL 10 MG/ML IV BOLUS
INTRAVENOUS | Status: AC
  Filled 2017-01-24: qty 20

## 2017-01-24 MED ORDER — MORPHINE SULFATE (PF) 4 MG/ML IV SOLN
4.0000 mg | INTRAVENOUS | Status: DC | PRN
  Administered 2017-01-24 (×2): 4 mg via INTRAVENOUS
  Filled 2017-01-24 (×3): qty 1

## 2017-01-24 MED ORDER — LIDOCAINE HCL 1 % IJ SOLN
INTRAMUSCULAR | Status: DC | PRN
  Administered 2017-01-24: 10 mL via INTRADERMAL

## 2017-01-24 MED ORDER — HYDRALAZINE HCL 20 MG/ML IJ SOLN: 10.0000 mg | INTRAMUSCULAR | Status: DC | PRN

## 2017-01-24 MED ORDER — SUCCINYLCHOLINE CHLORIDE 20 MG/ML IJ SOLN
INTRAMUSCULAR | Status: DC | PRN
  Administered 2017-01-24: 140 mg via INTRAVENOUS

## 2017-01-24 MED ORDER — SUCCINYLCHOLINE CHLORIDE 20 MG/ML IJ SOLN
INTRAMUSCULAR | Status: AC
  Filled 2017-01-24: qty 1

## 2017-01-24 MED ORDER — CIPROFLOXACIN IN D5W 400 MG/200ML IV SOLN
400.0000 mg | Freq: Once | INTRAVENOUS | Status: AC
  Administered 2017-01-24: 400 mg via INTRAVENOUS
  Filled 2017-01-24 (×2): qty 200

## 2017-01-24 MED ORDER — OXYCODONE-ACETAMINOPHEN 5-325 MG PO TABS
1.0000 | ORAL_TABLET | ORAL | Status: DC | PRN
  Administered 2017-01-25 (×2): 2 via ORAL
  Filled 2017-01-24 (×2): qty 2

## 2017-01-24 MED ORDER — SUGAMMADEX SODIUM 200 MG/2ML IV SOLN
INTRAVENOUS | Status: DC | PRN
  Administered 2017-01-24: 260 mg via INTRAVENOUS

## 2017-01-24 MED ORDER — FENTANYL CITRATE (PF) 250 MCG/5ML IJ SOLN
INTRAMUSCULAR | Status: AC
  Filled 2017-01-24: qty 5

## 2017-01-24 MED ORDER — DIPHENHYDRAMINE HCL 50 MG/ML IJ SOLN
25.0000 mg | Freq: Four times a day (QID) | INTRAMUSCULAR | Status: DC | PRN
Start: 2017-01-24 — End: 2017-01-25

## 2017-01-24 MED ORDER — LACTATED RINGERS IV SOLN
INTRAVENOUS | Status: DC | PRN
Start: 2017-01-24 — End: 2017-01-24
  Administered 2017-01-24: 21:00:00 via INTRAVENOUS

## 2017-01-24 MED ORDER — METRONIDAZOLE IN NACL 5-0.79 MG/ML-% IV SOLN
500.0000 mg | Freq: Three times a day (TID) | INTRAVENOUS | Status: DC
  Administered 2017-01-25 (×2): 500 mg via INTRAVENOUS
  Filled 2017-01-24 (×5): qty 100

## 2017-01-24 MED ORDER — SODIUM CHLORIDE 0.9 % IV SOLN
Freq: Once | INTRAVENOUS | Status: AC
  Administered 2017-01-24: 18:00:00 via INTRAVENOUS

## 2017-01-24 MED ORDER — DEXAMETHASONE SODIUM PHOSPHATE 10 MG/ML IJ SOLN
INTRAMUSCULAR | Status: DC | PRN
  Administered 2017-01-24: 10 mg via INTRAVENOUS

## 2017-01-24 MED ORDER — PROMETHAZINE HCL 25 MG/ML IJ SOLN: 6.2500 mg | INTRAMUSCULAR | Status: DC | PRN

## 2017-01-24 MED ORDER — SODIUM CHLORIDE 0.9 % IV SOLN
INTRAVENOUS | Status: DC | PRN
  Administered 2017-01-24: 21:00:00 via INTRAVENOUS

## 2017-01-24 MED ORDER — ROCURONIUM BROMIDE 50 MG/5ML IV SOLN
INTRAVENOUS | Status: AC
  Filled 2017-01-24: qty 1

## 2017-01-24 MED ORDER — PROPOFOL 10 MG/ML IV BOLUS
INTRAVENOUS | Status: AC
Start: 2017-01-24 — End: 2017-01-24
  Filled 2017-01-24: qty 20

## 2017-01-24 MED ORDER — FENTANYL CITRATE (PF) 100 MCG/2ML IJ SOLN
INTRAMUSCULAR | Status: DC | PRN
  Administered 2017-01-24: 100 ug via INTRAVENOUS
  Administered 2017-01-24: 50 ug via INTRAVENOUS
  Administered 2017-01-24: 100 ug via INTRAVENOUS

## 2017-01-24 MED ORDER — PROPOFOL 10 MG/ML IV BOLUS
INTRAVENOUS | Status: DC | PRN
Start: 2017-01-24 — End: 2017-01-24
  Administered 2017-01-24: 20 mg via INTRAVENOUS
  Administered 2017-01-24: 200 mg via INTRAVENOUS

## 2017-01-24 MED ORDER — ONDANSETRON 4 MG PO TBDP
4.0000 mg | ORAL_TABLET | Freq: Four times a day (QID) | ORAL | Status: DC | PRN
Start: 2017-01-24 — End: 2017-01-25

## 2017-01-24 MED ORDER — BUPIVACAINE-EPINEPHRINE (PF) 0.25% -1:200000 IJ SOLN
INTRAMUSCULAR | Status: DC | PRN
  Administered 2017-01-24: 10 mL via PERINEURAL

## 2017-01-24 MED ORDER — MIDAZOLAM HCL 2 MG/2ML IJ SOLN
INTRAMUSCULAR | Status: AC
  Filled 2017-01-24: qty 2

## 2017-01-24 MED ORDER — BUPIVACAINE-EPINEPHRINE (PF) 0.25% -1:200000 IJ SOLN
INTRAMUSCULAR | Status: AC
  Filled 2017-01-24: qty 30

## 2017-01-24 MED ORDER — MORPHINE SULFATE (PF) 4 MG/ML IV SOLN
4.0000 mg | INTRAVENOUS | Status: DC | PRN
Start: 2017-01-24 — End: 2017-01-25
  Administered 2017-01-25: 4 mg via INTRAVENOUS
  Filled 2017-01-24: qty 1

## 2017-01-24 MED ORDER — ROCURONIUM BROMIDE 100 MG/10ML IV SOLN
INTRAVENOUS | Status: DC | PRN
  Administered 2017-01-24: 30 mg via INTRAVENOUS

## 2017-01-24 MED ORDER — SODIUM CHLORIDE 0.9 % IV BOLUS (SEPSIS)
1000.0000 mL | Freq: Once | INTRAVENOUS | Status: AC
  Administered 2017-01-24: 1000 mL via INTRAVENOUS

## 2017-01-24 MED ORDER — MORPHINE SULFATE (PF) 4 MG/ML IV SOLN
4.0000 mg | Freq: Once | INTRAVENOUS | Status: AC
  Administered 2017-01-24: 4 mg via INTRAVENOUS

## 2017-01-24 MED ORDER — ONDANSETRON HCL 4 MG/2ML IJ SOLN: 4.0000 mg | Freq: Four times a day (QID) | INTRAMUSCULAR | Status: DC | PRN

## 2017-01-24 MED ORDER — CHLORHEXIDINE GLUCONATE CLOTH 2 % EX PADS: 6.0000 | MEDICATED_PAD | Freq: Once | CUTANEOUS | Status: DC

## 2017-01-24 MED ORDER — CLONAZEPAM 0.5 MG PO TABS: 0.5000 mg | ORAL_TABLET | Freq: Two times a day (BID) | ORAL | Status: DC | PRN

## 2017-01-24 MED ORDER — FENTANYL CITRATE (PF) 100 MCG/2ML IJ SOLN
INTRAMUSCULAR | Status: AC
  Administered 2017-01-24: 25 ug via INTRAVENOUS
  Filled 2017-01-24: qty 2

## 2017-01-24 MED ORDER — ONDANSETRON HCL 4 MG/2ML IJ SOLN
INTRAMUSCULAR | Status: DC | PRN
  Administered 2017-01-24: 4 mg via INTRAVENOUS

## 2017-01-24 MED ORDER — LIDOCAINE HCL (PF) 2 % IJ SOLN
INTRAMUSCULAR | Status: AC
  Filled 2017-01-24: qty 10

## 2017-01-24 MED ORDER — METRONIDAZOLE IN NACL 5-0.79 MG/ML-% IV SOLN
500.0000 mg | Freq: Once | INTRAVENOUS | Status: AC
  Administered 2017-01-24: 500 mg via INTRAVENOUS
  Filled 2017-01-24 (×2): qty 100

## 2017-01-24 MED ORDER — LIDOCAINE HCL (PF) 1 % IJ SOLN
INTRAMUSCULAR | Status: AC
  Filled 2017-01-24: qty 30

## 2017-01-24 MED ORDER — ONDANSETRON 4 MG PO TBDP
4.0000 mg | ORAL_TABLET | Freq: Once | ORAL | Status: AC | PRN
  Administered 2017-01-24: 4 mg via ORAL
  Filled 2017-01-24: qty 1

## 2017-01-24 MED ORDER — LIDOCAINE HCL (CARDIAC) 20 MG/ML IV SOLN
INTRAVENOUS | Status: DC | PRN
  Administered 2017-01-24: 50 mg via INTRAVENOUS

## 2017-01-24 MED ORDER — IOPAMIDOL (ISOVUE-300) INJECTION 61%
30.0000 mL | Freq: Once | INTRAVENOUS | Status: DC | PRN
  Filled 2017-01-24: qty 30

## 2017-01-24 MED ORDER — MIDAZOLAM HCL 2 MG/2ML IJ SOLN
INTRAMUSCULAR | Status: DC | PRN
  Administered 2017-01-24: 2 mg via INTRAVENOUS

## 2017-01-24 MED ORDER — IOPAMIDOL (ISOVUE-300) INJECTION 61%
100.0000 mL | Freq: Once | INTRAVENOUS | Status: AC | PRN
  Administered 2017-01-24: 100 mL via INTRAVENOUS
  Filled 2017-01-24: qty 100

## 2017-01-24 MED ORDER — CIPROFLOXACIN IN D5W 400 MG/200ML IV SOLN
400.0000 mg | Freq: Two times a day (BID) | INTRAVENOUS | Status: DC
  Administered 2017-01-25: 400 mg via INTRAVENOUS
  Filled 2017-01-24 (×3): qty 200

## 2017-01-24 MED ORDER — MEPERIDINE HCL 50 MG/ML IJ SOLN: 6.2500 mg | INTRAMUSCULAR | Status: DC | PRN

## 2017-01-24 MED ORDER — SUGAMMADEX SODIUM 500 MG/5ML IV SOLN
INTRAVENOUS | Status: AC
  Filled 2017-01-24: qty 5

## 2017-01-24 MED ORDER — OXYCODONE HCL 5 MG/5ML PO SOLN: 5.0000 mg | Freq: Once | ORAL | Status: DC | PRN

## 2017-01-24 MED ORDER — FENTANYL CITRATE (PF) 100 MCG/2ML IJ SOLN
25.0000 ug | INTRAMUSCULAR | Status: DC | PRN
  Administered 2017-01-24 (×4): 25 ug via INTRAVENOUS

## 2017-01-24 MED ORDER — LACTATED RINGERS IV SOLN: INTRAVENOUS | Status: AC

## 2017-01-24 MED ORDER — PROMETHAZINE HCL 25 MG/ML IJ SOLN
12.5000 mg | Freq: Four times a day (QID) | INTRAMUSCULAR | Status: DC | PRN
  Administered 2017-01-24: 12.5 mg via INTRAVENOUS
  Filled 2017-01-24: qty 1

## 2017-01-24 MED ORDER — INFLUENZA VAC SPLIT QUAD 0.5 ML IM SUSY
0.5000 mL | PREFILLED_SYRINGE | INTRAMUSCULAR | Status: AC
  Administered 2017-01-25: 0.5 mL via INTRAMUSCULAR
  Filled 2017-01-24: qty 0.5

## 2017-01-24 MED ORDER — DIPHENHYDRAMINE HCL 25 MG PO CAPS: 25.0000 mg | ORAL_CAPSULE | Freq: Four times a day (QID) | ORAL | Status: DC | PRN

## 2017-01-24 SURGICAL SUPPLY — 42 items
ADHESIVE MASTISOL STRL (MISCELLANEOUS) ×3 IMPLANT
APPLIER CLIP 5 13 M/L LIGAMAX5 (MISCELLANEOUS)
BLADE SURG SZ11 CARB STEEL (BLADE) ×3 IMPLANT
BULB RESERV EVAC DRAIN JP 100C (MISCELLANEOUS) IMPLANT
CANISTER SUCT 1200ML W/VALVE (MISCELLANEOUS) ×3 IMPLANT
CHLORAPREP W/TINT 26ML (MISCELLANEOUS) ×3 IMPLANT
CLIP APPLIE 5 13 M/L LIGAMAX5 (MISCELLANEOUS) IMPLANT
CLOSURE WOUND 1/2 X4 (GAUZE/BANDAGES/DRESSINGS) ×1
CUTTER FLEX LINEAR 45M (STAPLE) ×3 IMPLANT
DRAIN CHANNEL JP 19F (MISCELLANEOUS) IMPLANT
DRSG TEGADERM 2-3/8X2-3/4 SM (GAUZE/BANDAGES/DRESSINGS) ×9 IMPLANT
DRSG TELFA 4X3 1S NADH ST (GAUZE/BANDAGES/DRESSINGS) ×3 IMPLANT
ELECT REM PT RETURN 9FT ADLT (ELECTROSURGICAL) ×3
ELECTRODE REM PT RTRN 9FT ADLT (ELECTROSURGICAL) ×1 IMPLANT
GLOVE BIO SURGEON STRL SZ7.5 (GLOVE) ×6 IMPLANT
GLOVE INDICATOR 8.0 STRL GRN (GLOVE) ×9 IMPLANT
GOWN STRL REUS W/ TWL LRG LVL3 (GOWN DISPOSABLE) ×2 IMPLANT
GOWN STRL REUS W/TWL LRG LVL3 (GOWN DISPOSABLE) ×4
IRRIGATION STRYKERFLOW (MISCELLANEOUS) ×1 IMPLANT
IRRIGATOR STRYKERFLOW (MISCELLANEOUS) ×3
IV NS 1000ML (IV SOLUTION) ×2
IV NS 1000ML BAXH (IV SOLUTION) ×1 IMPLANT
KIT RM TURNOVER STRD PROC AR (KITS) ×3 IMPLANT
LABEL OR SOLS (LABEL) ×3 IMPLANT
NEEDLE HYPO 25X1 1.5 SAFETY (NEEDLE) ×3 IMPLANT
NEEDLE VERESS 14GA 120MM (NEEDLE) ×3 IMPLANT
NS IRRIG 500ML POUR BTL (IV SOLUTION) ×3 IMPLANT
PACK LAP CHOLECYSTECTOMY (MISCELLANEOUS) ×3 IMPLANT
POUCH SPECIMEN RETRIEVAL 10MM (ENDOMECHANICALS) ×3 IMPLANT
RELOAD 45 VASCULAR/THIN (ENDOMECHANICALS) ×3 IMPLANT
RELOAD STAPLE TA45 3.5 REG BLU (ENDOMECHANICALS) ×3 IMPLANT
SCALPEL HARMONIC ACE (MISCELLANEOUS) ×3 IMPLANT
SLEEVE ENDOPATH XCEL 5M (ENDOMECHANICALS) ×3 IMPLANT
STRIP CLOSURE SKIN 1/2X4 (GAUZE/BANDAGES/DRESSINGS) ×2 IMPLANT
SUT MNCRL 4-0 (SUTURE) ×2
SUT MNCRL 4-0 27XMFL (SUTURE) ×1
SUT VICRYL 0 UR6 27IN ABS (SUTURE) IMPLANT
SUTURE MNCRL 4-0 27XMF (SUTURE) ×1 IMPLANT
TRAY FOLEY W/METER SILVER 16FR (SET/KITS/TRAYS/PACK) ×3 IMPLANT
TROCAR XCEL 12X100 BLDLESS (ENDOMECHANICALS) ×3 IMPLANT
TROCAR XCEL NON-BLD 5MMX100MML (ENDOMECHANICALS) ×3 IMPLANT
TUBING INSUFFLATOR HI FLOW (MISCELLANEOUS) ×3 IMPLANT

## 2017-01-24 NOTE — Anesthesia Procedure Notes (Signed)
Performed by: Siah Kannan       

## 2017-01-24 NOTE — ED Triage Notes (Signed)
Pt c/o severe sharp lower abdominal pain, nausea, diarrhea that started this AM. Denies CP or SOB.

## 2017-01-24 NOTE — H&P (Signed)
SURGICAL ADMISSION HISTORY AND PHYSICAL  HISTORY OF PRESENT ILLNESS (HPI):  31 y.o. male presented to [facility] ED for evaluation of abdominal pain. Patient reports abdominal pain began as a dull lower abdominal ache yesterday evening, but continued to worsen overnight, preventing him from sleeping and becoming more focused at his RLQ. Patient also describes several days of diarrhea, attributed to "stomach flu" with worsening nausea following onset of abdominal pain. Patient says his pain has been somewhat controlled while in the ED, though recurs when the pain medication wears off. He otherwise reports subjective chills without fever and denies CP or SOB.  Surgery is consulted by ED physician Dr. Roxan Hockey in this context for evaluation and management of acute appendicitis.  PAST MEDICAL HISTORY (PMH):      Past Medical History:  Diagnosis Date  . ADD (attention deficit disorder)   . Allergic rhinitis   . ETOH abuse   . GERD (gastroesophageal reflux disease)   . Hypersomnia   . Parasomnia      PAST SURGICAL HISTORY (PSH):       Past Surgical History:  Procedure Laterality Date  . dislocated shoulder  2004 and 2006   Left  . TONSILLECTOMY    . WISDOM TOOTH EXTRACTION       MEDICATIONS:         Prior to Admission medications   Medication Sig Start Date End Date Taking? Authorizing Provider  amphetamine-dextroamphetamine (ADDERALL) 20 MG tablet Take 20 mg by mouth daily.   Yes [provider]  clonazePAM (KLONOPIN) 0.5 MG tablet TAKE 1-2 TABLETS AT BEDTIME 09/23/12  Yes Clance, Maree Krabbe, MD  esomeprazole (NEXIUM) 40 MG capsule Take 40 mg by mouth daily.  02/02/12  Yes Dianne Dun, MD  naproxen (NAPROSYN) 500 MG tablet Take 1 tablet (500 mg total) by mouth 2 (two) times daily as needed for mild pain, moderate pain or headache (TAKE WITH MEALS.). Patient not taking: Reported on 01/24/2017 12/03/13   Street, [place], PA-C  oxyCODONE-acetaminophen (PERCOCET)  5-325 MG per tablet Take 1-2 tablets by mouth every 6 (six) hours as needed for severe pain. Patient not taking: Reported on 01/24/2017 12/03/13   Street, [place], [place]     ALLERGIES:       Allergies  Allergen Reactions  . Penicillins Other (See Comments)    Has patient had a PCN reaction causing immediate rash, facial/tongue/throat swelling, SOB or lightheadedness with hypotension: No Has patient had a PCN reaction causing severe rash involving mucus membranes or skin necrosis: No Has patient had a PCN reaction that required hospitalization: No Has patient had a PCN reaction occurring within the last 10 years: No If all of the above answers are "NO", then may proceed with Cephalosporin use.      SOCIAL HISTORY:  Social History        Social History  . Marital status: Married    Spouse name: N/A  . Number of children: 0  . Years of education: N/A        Occupational History  . 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 Smoking status: Former Games developer  . Smokeless tobacco: Never Used  . Alcohol use Yes     Comment: Social  . Drug use: No  . Sexual activity: Not on file       Other Topics Concern  . 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 Hypertension Mother   . Heart disease Maternal Grandfather   . Diverticulitis Maternal Grandmother        colostomy     REVIEW OF SYSTEMS:  Constitutional: denies weight loss, fever, chills, or sweats  Eyes: denies any other vision changes, history of eye injury  ENT: denies sore throat, hearing problems  Respiratory: denies shortness of breath, wheezing  Cardiovascular: denies chest pain, palpitations  Gastrointestinal:  abdominal pain, N/V, and bowel function as per HPI Genitourinary: denies burning with urination or urinary frequency Musculoskeletal: denies any other joint pains or cramps  Skin: denies any other rashes or skin discolorations  Neurological: denies any other headache, dizziness, weakness  Psychiatric: denies any other depression, anxiety   All other review of systems were negative   VITAL SIGNS:  Temp:  [97.8 F (36.6 C)] 97.8 F (36.6 C) (10/10 1314) Pulse Rate:  [93] 93 (10/10 1314) Resp:  [18] 18 (10/10 1314) BP: (154)/(96) 154/96 (10/10 1314) SpO2:  [98 %] 98 % (10/10 1314) Weight:  [285 lb (129.3 kg)] 285 lb (129.3 kg) (10/10 1315)     Height:  (185.4 cm) Weight: 285 lb (129.3 kg) BMI (Calculated): 37.61   INTAKE/OUTPUT:  This shift: Total I/O In: 1000 [IV Piggyback:1000] Out: -   Last 2 shifts: @   PHYSICAL EXAM:  Constitutional:  -- Normal body habitus  -- Awake, alert, and oriented x3  Eyes:  -- Pupils equally round and reactive to light  -- No scleral icterus  Ear, nose, and throat:  -- No jugular venous distension  Pulmonary:  -- No crackles  -- Equal breath sounds bilaterally -- Breathing non-labored at rest Cardiovascular:  -- S1, S2 present  -- No pericardial rubs Gastrointestinal:  -- Abdomen soft and non-distended with suprapubic > RLQ abdominal tenderness to palpation, no guarding or rebound tenderness -- No abdominal masses appreciated, pulsatile or otherwise  Musculoskeletal and Integumentary:  -- Wounds or skin discoloration: None appreciated -- Extremities: B/L UE and LE FROM, hands and feet warm, no edema  Neurologic:  -- Motor function: intact and symmetric -- Sensation: intact and symmetric  Labs:  CBC Latest Ref Rng & Units 01/24/2017 12/03/2013 10/11/2011  WBC 3.8 - 10.6 K/uL 13.9(H) 9.8 6.3  Hemoglobin 13.0 - 18.0 g/dL 28.4 13.2 44.0  Hematocrit 40.0 - 52.0 % 46.5 47.5 47.3  Platelets 150 - 440 K/uL 233 241 197.0    CMP Latest Ref Rng & Units 01/24/2017 12/03/2013 12/04/2011  Glucose 65 - 99 mg/dL 102(V) 93 -  BUN 6 - 20 mg/dL 15 18 -  Creatinine 2.53 - 1.24 mg/dL 6.64(Q) 0.34 -  Sodium 135 - 145 mmol/L 136 141 -  Potassium 3.5 - 5.1 mmol/L 3.9 4.0 -  Chloride 101 - 111 mmol/L 100(L) 101 -  CO2 22 - 32 mmol/L 27 24 -  Calcium 8.9 - 10.3 mg/dL 9.2 9.7 -  Total Protein 6.5 - 8.1 g/dL 7.4(Q) - 7.4  Total Bilirubin 0.3 - 1.2 mg/dL 5.9(D) - 0.6  Alkaline Phos 38 - 126 U/L 48 - 52  AST 15 - 41 U/L 57(H) - 26  ALT 17 - 63 U/L 89(H) - 41   Imaging studies:  CT Abdomen and Pelvis with  Contrast (01/24/2017) Inflammatory changes of the appendix with appendicolith at the appendiceal base. Inflammatory changes of the adjacent fat. The appendix has a medial course and overlies the iliac vessels as well as the ureter. Unremarkable stomach. Small hiatal hernia. No abnormally distended small bowel or colon. Small fat containing umbilical hernia.   Assessment/Plan: (ICD-10's: K35.3) 31 y.o. male with acute appendicitis, complicated by pertinent comorbidities including history of EtOH abuse, GERD, and ADD.              - NPO, IVF             - pain control prn             - IV antibiotics (Cipro, Flagyl for penicillin allergic)             - all risks, benefits, and alternatives to appendectomy were discussed with the patient and his family, all of their questions were answered to their expressed satisfaction, patient expresses he wishes to proceed, and informed consent was obtained.             - also discussed was that Dr. Tonita Cong will likely be doing his surgery due to no available OR until after 7 pm             - will admit to surgical service and plan for laparoscopic appendectomy pending OR availability             - DVT prophylaxis, ambulation encouraged  All of the above findings and recommendations were discussed with the patient and his family, and all of patient's and his family's questions  were answered to his expressed satisfaction.  -- Scherrie Gerlach Earlene Plater, MD, RPVI [place]: [facility] General Surgery - Partnering for exceptional care. Office: [phone]

## 2017-01-24 NOTE — Anesthesia Postprocedure Evaluation (Signed)
Anesthesia Post Note  Patient: Russell Kane  Procedure(s) Performed: APPENDECTOMY LAPAROSCOPIC (N/A Abdomen)  Patient location during evaluation: PACU Anesthesia Type: General Level of consciousness: awake and alert and oriented Pain management: pain level controlled Vital Signs Assessment: post-procedure vital signs reviewed and stable Respiratory status: spontaneous breathing, nonlabored ventilation and respiratory function stable Cardiovascular status: blood pressure returned to baseline and stable Postop Assessment: no signs of nausea or vomiting Anesthetic complications: no     Last Vitals:  Vitals:   01/24/17 2235 01/24/17 2246  BP: (!) 141/89 (!) 155/76  Pulse: 98 90  Resp: 12 20  Temp: 36.7 C 36.7 C  SpO2: 97% 98%    Last Pain:  Vitals:   01/24/17 2246  TempSrc: Oral  PainSc:                  [name]

## 2017-01-24 NOTE — OR Nursing (Signed)
Dr.Penwarden notified about pt c/o chest discomfort on arrival to OR. MD acknowledged, No new orders.

## 2017-01-24 NOTE — Progress Notes (Signed)
  Patient seen and examined Agree with Dr. Jinny Sanders assessment of acute appendicitis.  Discussed the procedure in detail with the patient and his level. They voiced understanding and desired to proceed.  Plan for laparoscopic appendectomy as soon as the OR is available.  Ricarda Frame, MD [facility] General Surgeon [facility]  Day [facility] [phone] Night [facility] [phone]

## 2017-01-24 NOTE — Consult Note (Signed)
SURGICAL CONSULTATION NOTE (initial) - cpt: 16109  HISTORY OF PRESENT ILLNESS (HPI):  31 y.o. male presented to [facility] ED for evaluation of abdominal pain. Patient reports abdominal pain began as a dull lower abdominal ache yesterday evening, but continued to worsen overnight, preventing him from sleeping and becoming more focused at his RLQ. Patient also describes several days of diarrhea, attributed to "stomach flu" with worsening nausea following onset of abdominal pain. Patient says his pain has been somewhat controlled while in the ED, though recurs when the pain medication wears off. He otherwise reports subjective chills without fever and denies CP or SOB.  Surgery is consulted by ED physician Dr. Roxan Hockey in this context for evaluation and management of acute appendicitis.  PAST MEDICAL HISTORY (PMH):  Past Medical History:  Diagnosis Date  . ADD (attention deficit disorder)   . Allergic rhinitis   . ETOH abuse   . GERD (gastroesophageal reflux disease)   . Hypersomnia   . Parasomnia      PAST SURGICAL HISTORY (PSH):  Past Surgical History:  Procedure Laterality Date  . dislocated shoulder  2004 and 2006   Left  . TONSILLECTOMY    . WISDOM TOOTH EXTRACTION       MEDICATIONS:  Prior to Admission medications   Medication Sig Start Date End Date Taking? Authorizing Provider  amphetamine-dextroamphetamine (ADDERALL) 20 MG tablet Take 20 mg by mouth daily.   Yes [provider]  clonazePAM (KLONOPIN) 0.5 MG tablet TAKE 1-2 TABLETS AT BEDTIME 09/23/12  Yes Clance, Maree Krabbe, MD  esomeprazole (NEXIUM) 40 MG capsule Take 40 mg by mouth daily.  02/02/12  Yes Dianne Dun, MD  naproxen (NAPROSYN) 500 MG tablet Take 1 tablet (500 mg total) by mouth 2 (two) times daily as needed for mild pain, moderate pain or headache (TAKE WITH MEALS.). Patient not taking: Reported on 01/24/2017 12/03/13   Street, [place], PA-C  oxyCODONE-acetaminophen (PERCOCET) 5-325 MG per tablet Take 1-2  tablets by mouth every 6 (six) hours as needed for severe pain. Patient not taking: Reported on 01/24/2017 12/03/13   Street, [place], [place]     ALLERGIES:  Allergies  Allergen Reactions  . Penicillins Other (See Comments)    Has patient had a PCN reaction causing immediate rash, facial/tongue/throat swelling, SOB or lightheadedness with hypotension: No Has patient had a PCN reaction causing severe rash involving mucus membranes or skin necrosis: No Has patient had a PCN reaction that required hospitalization: No Has patient had a PCN reaction occurring within the last 10 years: No If all of the above answers are "NO", then may proceed with Cephalosporin use.      SOCIAL HISTORY:  Social History   Social History  . Marital status: Married    Spouse name: N/A  . Number of children: 0  . Years of education: N/A   Occupational History  . 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 Smoking status: Former Games developer  . Smokeless tobacco: Never Used  . Alcohol use Yes     Comment: Social  . Drug use: No  . Sexual activity: Not on file   Other Topics Concern  . 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  Hypertension Mother   . Heart disease Maternal Grandfather   . Diverticulitis Maternal Grandmother        colostomy     REVIEW OF SYSTEMS:  Constitutional: denies weight loss, fever, chills, or sweats  Eyes: denies any other vision changes, history of eye injury  ENT: denies sore throat, hearing problems  Respiratory: denies shortness of breath, wheezing  Cardiovascular: denies chest pain, palpitations  Gastrointestinal: abdominal pain, N/V, and bowel function as per HPI Genitourinary: denies burning with  urination or urinary frequency Musculoskeletal: denies any other joint pains or cramps  Skin: denies any other rashes or skin discolorations  Neurological: denies any other headache, dizziness, weakness  Psychiatric: denies any other depression, anxiety   All other review of systems were negative   VITAL SIGNS:  Temp:  [97.8 F (36.6 C)] 97.8 F (36.6 C) (10/10 1314) Pulse Rate:  [93] 93 (10/10 1314) Resp:  [18] 18 (10/10 1314) BP: (154)/(96) 154/96 (10/10 1314) SpO2:  [98 %] 98 % (10/10 1314) Weight:  [285 lb (129.3 kg)] 285 lb (129.3 kg) (10/10 1315)     Height:  (185.4 cm) Weight: 285 lb (129.3 kg) BMI (Calculated): 37.61   INTAKE/OUTPUT:  This shift: Total I/O In: 1000 [IV Piggyback:1000] Out: -   Last 2 shifts: @   PHYSICAL EXAM:  Constitutional:  -- Normal body habitus  -- Awake, alert, and oriented x3  Eyes:  -- Pupils equally round and reactive to light  -- No scleral icterus  Ear, nose, and throat:  -- No jugular venous distension  Pulmonary:  -- No crackles  -- Equal breath sounds bilaterally -- Breathing non-labored at rest Cardiovascular:  -- S1, S2 present  -- No pericardial rubs Gastrointestinal:  -- Abdomen soft and non-distended with suprapubic > RLQ abdominal tenderness to palpation, no guarding or rebound tenderness -- No abdominal masses appreciated, pulsatile or otherwise  Musculoskeletal and Integumentary:  -- Wounds or skin discoloration: None appreciated -- Extremities: B/L UE and LE FROM, hands and feet warm, no edema  Neurologic:  -- Motor function: intact and symmetric -- Sensation: intact and symmetric  Labs:  CBC Latest Ref Rng & Units 01/24/2017 12/03/2013 10/11/2011  WBC 3.8 - 10.6 K/uL 13.9(H) 9.8 6.3  Hemoglobin 13.0 - 18.0 g/dL 16.1 09.6 04.5  Hematocrit 40.0 - 52.0 % 46.5 47.5 47.3  Platelets 150 - 440 K/uL 233 241 197.0   CMP Latest Ref Rng & Units 01/24/2017 12/03/2013 12/04/2011  Glucose 65 - 99 mg/dL 409(W)  93 -  BUN 6 - 20 mg/dL 15 18 -  Creatinine 1.19 - 1.24 mg/dL 1.47(W) 2.95 -  Sodium 135 - 145 mmol/L 136 141 -  Potassium 3.5 - 5.1 mmol/L 3.9 4.0 -  Chloride 101 - 111 mmol/L 100(L) 101 -  CO2 22 - 32 mmol/L 27 24 -  Calcium 8.9 - 10.3 mg/dL 9.2 9.7 -  Total Protein 6.5 - 8.1 g/dL 6.2(Z) - 7.4  Total Bilirubin 0.3 - 1.2 mg/dL 3.0(Q) - 0.6  Alkaline Phos 38 - 126 U/L 48 - 52  AST 15 - 41 U/L 57(H) - 26  ALT 17 - 63 U/L 89(H) - 41   Imaging studies:  CT Abdomen and Pelvis with Contrast (01/24/2017) Inflammatory changes of the appendix with appendicolith at the appendiceal base. Inflammatory changes of the adjacent fat. The appendix has a medial course and overlies the iliac vessels as well as the ureter. Unremarkable stomach. Small hiatal hernia. No abnormally distended small bowel or colon.  Small fat containing umbilical hernia.   Assessment/Plan: (ICD-10's: K35.3) 31 y.o. male with acute appendicitis, complicated by pertinent comorbidities including history of EtOH abuse, GERD, and ADD.   - NPO, IVF  - pain control prn  - IV antibiotics (Cipro, Flagyl for penicillin allergic)  - all risks, benefits, and alternatives to appendectomy were discussed with the patient and his family, all of their questions were answered to their expressed satisfaction, patient expresses he wishes to proceed, and informed consent was obtained.  - also discussed was that Dr. Tonita Cong will likely be doing his surgery due to no available OR until after 7 pm  - will admit to surgical service and plan for laparoscopic appendectomy pending OR availability  - DVT prophylaxis, ambulation encouraged  All of the above findings and recommendations were discussed with the patient and his family, and all of patient's and his family's questions were answered to his expressed satisfaction.  Thank you for the opportunity to participate in this patient's care.   -- Scherrie Gerlach Earlene Plater, MD, RPVI [place]: [facility] General Surgery - Partnering for exceptional care. Office: [phone]

## 2017-01-24 NOTE — Op Note (Signed)
laparascopic appendectomy   [name] Date of operation:  01/24/2017  Indications: The patient presented with a history of  abdominal pain. Workup has revealed findings consistent with acute appendicitis.  Pre-operative Diagnosis: Acute appendicitis without mention of peritonitis  Post-operative Diagnosis: Same  Surgeon: Leonette Most T. Tonita Cong, MD, FACS  Anesthesia: General with endotracheal tube  Procedure Details  The patient was seen again in the preop area. The options of surgery versus observation were reviewed with the patient and/or family. The risks of bleeding, infection, recurrence of symptoms, negative laparoscopy, potential for an open procedure, bowel injury, abscess or infection, were all reviewed as well. The patient was taken to Operating Room, identified as Russell Kane and the procedure verified as laparoscopic appendectomy. A Time Out was held and the above information confirmed.  The patient was placed in the supine position and general anesthesia was induced.  Antibiotic prophylaxis was administered and VT E prophylaxis was in place. A Foley catheter was placed by the nursing staff.   The abdomen was prepped and draped in a sterile fashion. An infraumbilical incision was made. A Veress needle was placed and pneumoperitoneum was obtained. A 5 mm Optiview trocar port was placed without difficulty and the abdominal cavity was explored.  Under direct vision a 5 mm suprapubic port was placed and a 12 mm left lateral port was placed all under direct vision.  The appendix was identified and found to be acutely inflamed with inflammatory adhesions to the terminal ileum and lateral sidewall of the abdomen. The adhesions were taken down bluntly.   The appendix was carefully dissected. The base of the appendix was dissected out and divided with a standard load Endo GIA. The mesoappendix was divided with the use of a harmonic scalpel.  The appendix was passed out through the  left lateral port site with the aid of an Endo Catch bag. The right lower quadrant and pelvis was then irrigated with copious amounts of normal saline which was aspirated. Inspection  failed to identify any additional bleeding and there were no signs of bowel injury.   Again the right lower quadrant was inspected there was no sign of bleeding or bowel injury therefore pneumoperitoneum was released, all ports were removed and the fascia of the left lower quadrant port was closed with a 0 Vicryl in a figure-of-eight fashion, skin incisions were approximated with subcuticular 4-0 Monocryl. Steri-Strips and Mastisol and sterile dressings were placed.  The patient tolerated the procedure well, there were no complications. The sponge lap and needle count were correct at the end of the procedure.  The patient was taken to the recovery room in stable condition to be admitted for continued care.  Findings: Acute appendicitis  Estimated Blood Loss: 10 mL                  Specimens: appendix         Complications:  None                  Ricarda Frame MD, FACS

## 2017-01-24 NOTE — Brief Op Note (Signed)
01/24/2017  9:40 PM  PATIENT:  Russell Kane  31 y.o. male  PRE-OPERATIVE DIAGNOSIS:  appendicitis  POST-OPERATIVE DIAGNOSIS:  appendicitis  PROCEDURE:  Procedure(s): APPENDECTOMY LAPAROSCOPIC (N/A)  SURGEON:  Surgeon(s) and Role:    * Ricarda Frame, MD - Primary  PHYSICIAN ASSISTANT:   ASSISTANTS: none   ANESTHESIA:   general  EBL:  Total I/O In: 500 [I.V.:500] Out: -   BLOOD ADMINISTERED:none  DRAINS: none   LOCAL MEDICATIONS USED:  MARCAINE   , XYLOCAINE  and Amount: 20 ml  SPECIMEN:  Source of Specimen:  appendix  DISPOSITION OF SPECIMEN:  PATHOLOGY  COUNTS:  YES  TOURNIQUET:  * No tourniquets in log *  DICTATION: .Dragon Dictation  PLAN OF CARE: Admit for overnight observation  PATIENT DISPOSITION:  PACU - hemodynamically stable.   Delay start of Pharmacological VTE agent (>24hrs) due to surgical blood loss or risk of bleeding: no

## 2017-01-24 NOTE — Anesthesia Post-op Follow-up Note (Signed)
Anesthesia QCDR form completed.        

## 2017-01-24 NOTE — ED Provider Notes (Signed)
[facility] Emergency Department Provider Note    None    (approximate)  I have reviewed the triage vital signs and the nursing notes.   HISTORY  Chief Complaint Abdominal Pain    HPI Grace Haggart is a 31 y.o. male resents with chief complaint of severe low were abdominal pain radiating to right lower quadrant associated with watery diarrhea. Describes the pain as crampy and throbbing in nature. His never had pain like this before. Denies any chest pain or shortness of breath. No fevers but does endorse chills. Has had episodes of a chronic diarrhea in the past and also history of alcohol abuse. No previous abdominal surgeries.   Past Medical History:  Diagnosis Date  . ADD (attention deficit disorder)   . Allergic rhinitis   . ETOH abuse   . GERD (gastroesophageal reflux disease)   . Hypersomnia   . Parasomnia    Family History  Problem Relation Age of Onset  . Hypertension Mother   . Heart disease Maternal Grandfather   . Diverticulitis Maternal Grandmother        colostomy   Past Surgical History:  Procedure Laterality Date  . dislocated shoulder  2004 and 2006   Left  . TONSILLECTOMY    . WISDOM TOOTH EXTRACTION     Patient Active Problem List   Diagnosis Date Noted  . IBS (irritable bowel syndrome) 11/08/2011  . Cerumen impaction 01/05/2011  . URI 07/07/2009  . OTITIS EXTERNA, ACUTE 01/09/2008  . HEMATEMESIS 07/17/2007  . PARASOMNIA 04/29/2007  . HYPERSOMNIA UNSPECIFIED 03/01/2007  . ALLERGIC RHINITIS 01/11/2007  . SHOULDER DISLOCATION, RECURRENT 01/11/2007  . ADD 11/21/2006  . CARPAL TUNNEL SYNDROME, BILATERAL 11/21/2006  . GERD 11/21/2006      Prior to Admission medications   Medication Sig Start Date End Date Taking? Authorizing Provider  amphetamine-dextroamphetamine (ADDERALL) 20 MG tablet Take 20 mg by mouth daily.    [provider]  aspirin EC 81 MG tablet Take 486 mg by mouth once.    [provider]  clonazePAM (KLONOPIN) 0.5 MG tablet TAKE 1-2 TABLETS AT BEDTIME 09/23/12   Clance, Maree Krabbe, MD  esomeprazole (NEXIUM) 40 MG capsule Take 40 mg by mouth daily.  02/02/12   Dianne Dun, MD  loratadine (CLARITIN) 10 MG tablet Take 10 mg by mouth daily.    [provider]  naproxen (NAPROSYN) 500 MG tablet Take 1 tablet (500 mg total) by mouth 2 (two) times daily as needed for mild pain, moderate pain or headache (TAKE WITH MEALS.). 12/03/13   Street, [place], PA-C  oxyCODONE-acetaminophen (PERCOCET) 5-325 MG per tablet Take 1-2 tablets by mouth every 6 (six) hours as needed for severe pain. 12/03/13   Street, [place], PA-C    Allergies Penicillins    Social History Social History  Substance Use Topics  . Smoking status: Former Games developer  . Smokeless tobacco: Never Used  . Alcohol use Yes     Comment: Social    Review of Systems Patient denies headaches, rhinorrhea, blurry vision, numbness, shortness of breath, chest pain, edema, cough, abdominal pain, nausea, vomiting, diarrhea, dysuria, fevers, rashes or hallucinations unless otherwise stated above in HPI. ____________________________________________   PHYSICAL EXAM:  VITAL SIGNS: Vitals:   01/24/17 1314  BP: (!) 154/96  Pulse: 93  Resp: 18  Temp: 97.8 F (36.6 C)  SpO2: 98%    Constitutional: Alert and oriented. Well appearing and in no acute distress. Eyes: Conjunctivae are normal.  Head: Atraumatic.  Nose: No congestion/rhinnorhea. Mouth/Throat: Mucous membranes are moist.   Neck: No stridor. Painless ROM.  Cardiovascular: Normal rate, regular rhythm. Grossly normal heart sounds.  Good peripheral circulation. Respiratory: Normal respiratory effort.  No retractions. Lungs CTAB. Gastrointestinal: Soft and with ttp of suprapubic region and RLQ, no hernias or masses. No distention. No abdominal bruits. No CVA tenderness. Musculoskeletal: No lower extremity tenderness nor edema.  No joint  effusions. Neurologic:  Normal speech and language. No gross focal neurologic deficits are appreciated. No facial droop Skin:  Skin is warm, dry and intact. No rash noted. Psychiatric: Mood and affect are normal. Speech and behavior are normal.  ____________________________________________   LABS (all labs ordered are listed, but only abnormal results are displayed)  Results for orders placed or performed during the hospital encounter of 01/24/17 (from the past 24 hour(s))  Lipase, blood     Status: None   Collection Time: 01/24/17  1:17 PM  Result Value Ref Range   Lipase 21 11 - 51 U/L  Comprehensive metabolic panel     Status: Abnormal   Collection Time: 01/24/17  1:17 PM  Result Value Ref Range   Sodium 136 135 - 145 mmol/L   Potassium 3.9 3.5 - 5.1 mmol/L   Chloride 100 (L) 101 - 111 mmol/L   CO2 27 22 - 32 mmol/L   Glucose, Bld 107 (H) 65 - 99 mg/dL   BUN 15 6 - 20 mg/dL   Creatinine, Ser 1.61 (H) 0.61 - 1.24 mg/dL   Calcium 9.2 8.9 - 09.6 mg/dL   Total Protein 8.4 (H) 6.5 - 8.1 g/dL   Albumin 4.4 3.5 - 5.0 g/dL   AST 57 (H) 15 - 41 U/L   ALT 89 (H) 17 - 63 U/L   Alkaline Phosphatase 48 38 - 126 U/L   Total Bilirubin 1.4 (H) 0.3 - 1.2 mg/dL   GFR calc non Af Amer >60 >60 mL/min   GFR calc Af Amer >60 >60 mL/min   Anion gap 9 5 - 15  CBC     Status: Abnormal   Collection Time: 01/24/17  1:17 PM  Result Value Ref Range   WBC 13.9 (H) 3.8 - 10.6 K/uL   RBC 5.29 4.40 - 5.90 MIL/uL   Hemoglobin 16.3 13.0 - 18.0 g/dL   HCT 04.5 40.9 - 81.1 %   MCV 87.9 80.0 - 100.0 fL   MCH 30.8 26.0 - 34.0 pg   MCHC 35.0 32.0 - 36.0 g/dL   RDW 91.4 78.2 - 95.6 %   Platelets 233 150 - 440 K/uL  Urinalysis, Complete w Microscopic     Status: Abnormal   Collection Time: 01/24/17  1:17 PM  Result Value Ref Range   Color, Urine YELLOW (A) YELLOW   APPearance CLEAR (A) CLEAR   Specific Gravity, Urine 1.018 1.005 - 1.030   pH 7.0 5.0 - 8.0   Glucose, UA NEGATIVE NEGATIVE mg/dL   Hgb  urine dipstick NEGATIVE NEGATIVE   Bilirubin Urine NEGATIVE NEGATIVE   Ketones, ur NEGATIVE NEGATIVE mg/dL   Protein, ur NEGATIVE NEGATIVE mg/dL   Nitrite NEGATIVE NEGATIVE   Leukocytes, UA NEGATIVE NEGATIVE   RBC / HPF 0-5 0 - 5 RBC/hpf   WBC, UA 0-5 0 - 5 WBC/hpf   Bacteria, UA RARE (A) NONE SEEN   Squamous Epithelial / LPF NONE SEEN NONE SEEN   Mucus PRESENT    ____________________________________________ ____________________________________________  RADIOLOGY  I personally reviewed all radiographic images ordered to evaluate for  the above acute complaints and reviewed radiology reports and findings.  These findings were personally discussed with the patient.  Please see medical record for radiology report.  ____________________________________________   PROCEDURES  Procedure(s) performed:  Procedures    Critical Care performed: no ____________________________________________   INITIAL IMPRESSION / ASSESSMENT AND PLAN / ED COURSE  Pertinent labs & imaging results that were available during my care of the patient were reviewed by me and considered in my medical decision making (see chart for details).  DDX: appy, colitis, uti, hernia, perf, diverticulitis, ibd, msk strain  Suzanne Garbers is a 31 y.o. who presents to the ED with of suprapubic and right lower quadrant abdominal pain as described above. Patient afebrile but does have a mild white count and is tender on exam. CT imaging ordered for the above differential shows evidence of acute appendicitis with appendicolith. I spoke with Dr. Earlene Plater of general surgery who agrees to evaluate patient for further evaluation and management.  Have discussed with the patient and available family all diagnostics and treatments performed thus far and all questions were answered to the best of my ability. The patient demonstrates understanding and agreement with plan.       ____________________________________________   FINAL  CLINICAL IMPRESSION(S) / ED DIAGNOSES  Final diagnoses:  Appendicitis, acute, with peritonitis      NEW MEDICATIONS STARTED DURING THIS VISIT:  New Prescriptions   No medications on file     Note:  This document was prepared using Dragon voice recognition software and may include unintentional dictation errors.    Willy Eddy, MD 01/24/17 [phone]

## 2017-01-24 NOTE — Anesthesia Preprocedure Evaluation (Signed)
Anesthesia Evaluation  Patient identified by MRN, date of birth, ID band Patient awake    Reviewed: Allergy & Precautions, NPO status , Patient's Chart, lab work & pertinent test results  History of Anesthesia Complications Negative for: history of anesthetic complications  Airway Mallampati: III  TM Distance: >3 FB Neck ROM: Full    Dental no notable dental hx.    Pulmonary neg sleep apnea, neg COPD, former smoker,    breath sounds clear to auscultation- rhonchi (-) wheezing      Cardiovascular Exercise Tolerance: Good (-) hypertension(-) CAD, (-) Past MI and (-) Cardiac Stents  Rhythm:Regular Rate:Normal - Systolic murmurs and - Diastolic murmurs    Neuro/Psych PSYCHIATRIC DISORDERS (ADD) negative neurological ROS     GI/Hepatic Neg liver ROS, GERD  ,  Endo/Other  negative endocrine ROSneg diabetes  Renal/GU negative Renal ROS     Musculoskeletal negative musculoskeletal ROS (+)   Abdominal (+) + obese,   Peds  Hematology negative hematology ROS (+)   Anesthesia Other Findings Past Medical History: No date: ADD (attention deficit disorder) No date: Allergic rhinitis No date: ETOH abuse No date: GERD (gastroesophageal reflux disease) No date: Hypersomnia No date: Parasomnia   Reproductive/Obstetrics                             Anesthesia Physical Anesthesia Plan  ASA: II and emergent  Anesthesia Plan: General   Post-op Pain Management:    Induction: Intravenous, Rapid sequence and Cricoid pressure planned  PONV Risk Score and Plan: 1 and Ondansetron and Dexamethasone  Airway Management Planned: Oral ETT  Additional Equipment:   Intra-op Plan:   Post-operative Plan: Extubation in OR  Informed Consent: I have reviewed the patients History and Physical, chart, labs and discussed the procedure including the risks, benefits and alternatives for the proposed anesthesia with  the patient or authorized representative who has indicated his/her understanding and acceptance.   Dental advisory given  Plan Discussed with: CRNA and Anesthesiologist  Anesthesia Plan Comments:         Anesthesia Quick Evaluation

## 2017-01-24 NOTE — Anesthesia Procedure Notes (Addendum)
Procedure Name: Intubation Date/Time: 01/24/2017 8:44 PM Performed by: Waldo Laine Pre-anesthesia Checklist: Patient identified, Patient being monitored, Timeout performed, Emergency Drugs available and Suction available Patient Re-evaluated:Patient Re-evaluated prior to induction Oxygen Delivery Method: Circle system utilized Preoxygenation: Pre-oxygenation with 100% oxygen Induction Type: IV induction, Cricoid Pressure applied and Inhalational induction Laryngoscope Size: Miller and 2 Grade View: Grade I Tube type: Oral Tube size: 7.0 mm Number of attempts: 1 Airway Equipment and Method: Stylet Placement Confirmation: ETT inserted through vocal cords under direct vision,  positive ETCO2 and breath sounds checked- equal and bilateral Secured at: 21 cm Tube secured with: Tape Dental Injury: Teeth and Oropharynx as per pre-operative assessment

## 2017-01-24 NOTE — Transfer of Care (Signed)
Immediate Anesthesia Transfer of Care Note  Patient: Russell Kane  Procedure(s) Performed: APPENDECTOMY LAPAROSCOPIC (N/A Abdomen)  Patient Location: PACU  Anesthesia Type:General  Level of Consciousness: sedated and patient cooperative  Airway & Oxygen Therapy: Patient Spontanous Breathing  Post-op Assessment: Report given to RN and Post -op Vital signs reviewed and stable  Post vital signs: Reviewed and stable  Last Vitals:  Vitals:   01/24/17 1314 01/24/17 1900  BP: (!) 154/96 (!) 154/101  Pulse: 93 85  Resp: 18 16  Temp: 36.6 C   SpO2: 98% 100%    Last Pain:  Vitals:   01/24/17 1928  TempSrc:   PainSc: 3          Complications: No apparent anesthesia complications

## 2017-01-25 ENCOUNTER — Encounter: Payer: Self-pay | Admitting: General Surgery

## 2017-01-25 MED ORDER — OXYCODONE-ACETAMINOPHEN 5-325 MG PO TABS: 1.0000 | ORAL_TABLET | ORAL | 0 refills | Status: DC | PRN

## 2017-01-25 NOTE — Progress Notes (Signed)
1 Day Post-Op  Subjective: Patient feels well wants to advance his diet from clears. No nausea vomiting and minimal pain  Objective: Vital signs in last 24 hours: Temp:  [97.8 F (36.6 C)-98.5 F (36.9 C)] 98.2 F (36.8 C) (10/11 0513) Pulse Rate:  [69-103] 76 (10/11 0513) Resp:  [10-20] 20 (10/11 0513) BP: (100-166)/(68-101) 100/68 (10/11 0513) SpO2:  [92 %-100 %] 96 % (10/11 0513) Weight:  [285 lb (129.3 kg)] 285 lb (129.3 kg) (10/10 1315) Last BM Date: 01/23/17  Intake/Output from previous day: 10/10 0701 - 10/11 0700 In: 3380 [P.O.:480; I.V.:1500; IV Piggyback:1400] Out: 1790 [Urine:1775; Blood:15] Intake/Output this shift: No intake/output data recorded.  Physical exam:  Wounds are clean dressings intact nontender with nontender calves  Lab Results: CBC   Recent Labs  01/24/17 1317  WBC 13.9*  HGB 16.3  HCT 46.5  PLT 233   BMET  Recent Labs  01/24/17 1317  NA 136  K 3.9  CL 100*  CO2 27  GLUCOSE 107*  BUN 15  CREATININE 1.28*  CALCIUM 9.2   PT/INR No results for input(s): LABPROT, INR in the last 72 hours. ABG No results for input(s): PHART, HCO3 in the last 72 hours.  Invalid input(s): PCO2, PO2  Studies/Results: Ct Abdomen Pelvis W Contrast  Result Date: 01/24/2017 CLINICAL DATA:  31 year old male with a history of right lower quadrant pain EXAM: CT ABDOMEN AND PELVIS WITH CONTRAST TECHNIQUE: Multidetector CT imaging of the abdomen and pelvis was performed using the standard protocol following bolus administration of intravenous contrast. CONTRAST:  ISOVUE-300 IOPAMIDOL (ISOVUE-300) INJECTION 61% COMPARISON:  None. FINDINGS: Lower chest: No acute abnormality. Hepatobiliary: Diffusely decreased attenuation/enhancement of liver parenchyma. Unremarkable gallbladder. Pancreas: Unremarkable pancreas Spleen: Unremarkable spleen Adrenals/Urinary Tract: Unremarkable adrenal glands. Unremarkable bilateral kidneys with no hydronephrosis or  nephrolithiasis. Unremarkable course of the left ureter. Note that the right ureter is immediately adjacent to the inflamed appendix within the right abdomen. Stomach/Bowel: Unremarkable stomach. Small hiatal hernia. No abnormally distended small bowel or colon. Inflammatory changes of the appendix with appendicolith at the appendiceal base. Inflammatory changes of the adjacent fat. The appendix has a medial course and overlies the iliac vessels as well as the ureter. Colonic diverticulae a. Vascular/Lymphatic: No vascular calcifications. No dissection. No aneurysm. Iliac and proximal femoral vessels patent. Small lymph nodes of the right lower quadrant. Reproductive: Unremarkable pelvic structures. Other: Small fat containing umbilical hernia Musculoskeletal: No acute fracture. IMPRESSION: Acute appendicitis with no complicating features. Appendicolith at the appendiceal base. Note that the appendix has a medial course and lies immediately adjacent to the ureter. These results were called by telephone at the time of interpretation on 01/24/2017 at 3:41 pm to the nurse caring for this patient, Ms Carolan Shiver, who verbally acknowledged these results. Steatosis Electronically Signed   By: Gilmer Mor D.O.   On: 01/24/2017 15:46    Anti-infectives: Anti-infectives    Start     Dose/Rate Route Frequency Ordered Stop   01/25/17 0430  ciprofloxacin (CIPRO) IVPB 400 mg     400 mg 200 mL/hr over 60 Minutes Intravenous Every 12 hours 01/24/17 2245     01/25/17 0200  metroNIDAZOLE (FLAGYL) IVPB 500 mg     500 mg 100 mL/hr over 60 Minutes Intravenous Every 8 hours 01/24/17 2245     01/24/17 1615  ciprofloxacin (CIPRO) IVPB 400 mg     400 mg 200 mL/hr over 60 Minutes Intravenous  Once 01/24/17 1600 01/24/17 1738   01/24/17 1615  metroNIDAZOLE (FLAGYL) IVPB 500 mg     500 mg 100 mL/hr over 60 Minutes Intravenous  Once 01/24/17 1600 01/24/17 1848      Assessment/Plan: s/p Procedure(s): APPENDECTOMY  LAPAROSCOPIC   We'll advance diet, probably home later today.  Lattie Haw, MD, FACS  01/25/2017

## 2017-01-25 NOTE — Discharge Instructions (Signed)
Remove dressing in 24 hours. °May shower in 24 hours. ° °Resume all home medications. °Follow-up with Dr. Kahne Helfand in 10 days. °

## 2017-01-25 NOTE — Progress Notes (Signed)
01/25/2017 2:44 PM  Russell Kane to be D/C'd Home per MD order.  Discussed prescriptions and follow up appointments with the patient. Prescriptions given to patient, medication list explained in detail. Pt verbalized understanding.  Allergies as of 01/25/2017      Reactions   Penicillins Other (See Comments)   Has patient had a PCN reaction causing immediate rash, facial/tongue/throat swelling, SOB or lightheadedness with hypotension: No Has patient had a PCN reaction causing severe rash involving mucus membranes or skin necrosis: No Has patient had a PCN reaction that required hospitalization: No Has patient had a PCN reaction occurring within the last 10 years: No If all of the above answers are "NO", then may proceed with Cephalosporin use.      Medication List    TAKE these medications   amphetamine-dextroamphetamine 20 MG tablet Commonly known as:  ADDERALL Take 20 mg by mouth daily.   clonazePAM 0.5 MG tablet Commonly known as:  KLONOPIN TAKE 1-2 TABLETS AT BEDTIME   esomeprazole 40 MG capsule Commonly known as:  NEXIUM Take 40 mg by mouth daily.   oxyCODONE-acetaminophen 5-325 MG tablet Commonly known as:  PERCOCET/ROXICET Take 1-2 tablets by mouth every 4 (four) hours as needed for moderate pain.       Vitals:   01/25/17 0513 01/25/17 1315  BP: 100/68 110/63  Pulse: 76 81  Resp: 20 18  Temp: 98.2 F (36.8 C) 98.3 F (36.8 C)  SpO2: 96% 98%    Skin clean, dry and intact without evidence of skin break down, no evidence of skin tears noted. IV catheter discontinued intact. Site without signs and symptoms of complications. Dressing and pressure applied. Pt denies pain at this time. No complaints noted.  An After Visit Summary was printed and given to the patient. Patient escorted via WC, and D/C home via private auto.  Russell Kane

## 2017-01-25 NOTE — Progress Notes (Signed)
Patient is tolerating a regular diet and feeling well he wants to be discharged this afternoon. No change in exam  Patient doing very well will discharge today to follow-up in 10 days

## 2017-01-26 ENCOUNTER — Other Ambulatory Visit: Payer: Self-pay | Admitting: General Practice

## 2017-01-26 MED ORDER — OXYCODONE-ACETAMINOPHEN 5-325 MG PO TABS: 1.0000 | ORAL_TABLET | ORAL | 0 refills | Status: DC | PRN

## 2017-01-26 NOTE — Telephone Encounter (Signed)
Spoke with the patient's wife at this time whom explained scenario with his pain medication.  Call was made to [facility] in [place] at this time. Spoke with Pharmacist. He advised me to cancel prescription at [facility] as they are still without power and to [facility] a new [facility].  Dr. Excell Seltzer has signed new prescription for patient. Patient's wife was notified that other prescription has been cancelled and she may pick new prescription up at office and take to [facility] for this to be filled. She verbalizes understanding.

## 2017-01-26 NOTE — Telephone Encounter (Signed)
Patient wife is calling, she [facility] his [facility] medication, however the pharmacy is without power and unable to fill patient's rx.patient's wife is asking if the rx can be called in somewhere else or into the pharmacy please call patient or patients wife and advice.

## 2017-01-27 LAB — SURGICAL PATHOLOGY

## 2017-02-01 ENCOUNTER — Telehealth: Payer: Self-pay | Admitting: Surgery

## 2017-02-01 NOTE — Telephone Encounter (Signed)
Surgery on 10/10. Patient still having a lot bloating and pain. Has not had solid stools. Incision on left side draining and red. Please advise.

## 2017-02-01 NOTE — Telephone Encounter (Signed)
Called patient back and spoke with his wife, [name]. She stated that her husband was having some redness around his left side incision with yellowish clear fluid. She also stated that he's not able to sleep at night since he continues to have abdominal pain, bloating and loose stools. She denied him having fever, chills, nausea and vomiting. I told her that this was all normal since he recently had surgery. I also told her to tell her husband to walk around the house to help with the abdominal bloating, drink plenty of fluids and if they noticed that the redness around the left sided incision was getting large and developed fever or chills, then he would need to go to the ED. Kassie understood and had no further questions.

## 2017-02-08 ENCOUNTER — Encounter: Payer: Self-pay | Admitting: Surgery

## 2017-02-08 ENCOUNTER — Ambulatory Visit (INDEPENDENT_AMBULATORY_CARE_PROVIDER_SITE_OTHER): Payer: No Typology Code available for payment source | Admitting: Surgery

## 2017-02-08 VITALS — BP 141/82 | HR 101 | Temp 98.1°F | Ht 73.0 in | Wt 283.4 lb

## 2017-02-08 DIAGNOSIS — K3580 Unspecified acute appendicitis: Secondary | ICD-10-CM

## 2017-02-08 DIAGNOSIS — R197 Diarrhea, unspecified: Secondary | ICD-10-CM

## 2017-02-08 NOTE — Patient Instructions (Signed)
Please do not lift anything greater than 15 pounds until 02/21/17.  Please go by the lab today and pick up your prep kit for the test we have ordered.  We will call you with the results.  Please call our office if you have questions or concerns.

## 2017-02-08 NOTE — Progress Notes (Signed)
Outpatient postop visit  02/08/2017  Russell DonathJustin Kane is an 31 y.o. male.    Procedure: Laparoscopic appendectomy  CC: No problems except occasional loose stools.  HPI: This patient is status post laparoscopic appendectomy.  Patient describes loose stools after eating every meal.  He has no fevers or chills no nausea or vomiting and no melena or hematochezia.  Medications reviewed.    Physical Exam:  BP (!) 141/82   Pulse (!) 101   Temp 98.1 F (36.7 C) (Oral)   Ht 6\' 1"  (1.854 m)   Wt 283 lb 6.4 oz (128.5 kg)   BMI 37.39 kg/m     PE: Soft nontender abdomen wounds clean no erythema no drainage.    Assessment/Plan:  Pathology showed acute separative appendicitis no malignancy.  Because he is having so many loose stools with every meal I would recommend checking a C. difficile at this point.  I doubt that this is positive but because of the number of stools that he is having that are not completely diarrheal it is warranting further investigation.  Otherwise he is doing well and can follow-up on an as-needed basis reminded of no heavy lifting for another 2 weeks.  We will call him with the results of the C. difficile test.  Lattie Hawichard E Kanika Bungert, MD, FACS

## 2017-02-09 ENCOUNTER — Telehealth: Payer: Self-pay

## 2017-02-09 NOTE — Telephone Encounter (Signed)
Tried reaching patient at this time to check on C-Diff test, left message for patient to call back and let us know if he was planning to have test done.

## 2017-03-23 ENCOUNTER — Telehealth: Payer: Self-pay | Admitting: General Surgery

## 2017-03-23 NOTE — Telephone Encounter (Signed)
Spoke with patient's wife, [name]. She stated that her husband was still having abdominal cramping and loose stools since surgery. She stated that her husband was told that if his stools continued to be loose, then to let us know so we could order a test. I asked [name] if his bowel movements had any improve from last time he was seen or if it had gotten worse or stayed the same. She stated that he had stayed the same. I then recommended for his to see Dr. Tonita CongWoodham on 03/29/2017 to discuss his symptoms again and therefore, he would let him know then what were his recommendations. [name] denied her husband having any redness on his incisions, fever, chills and drainage from incisions. Kassie agreed with my recommendations and had no further questions.

## 2017-03-23 NOTE — Telephone Encounter (Signed)
Patients wife is calling at patients last follow up appointment they wanted to get a stool sample, but the patients wife was in the hospital, patient is still having stomach cramping and diarrhea and wanted to know if Dr. Tonita CongWoodham still wanted to order a stool sample, due to the patient is still having the same symptom. Please call patient or patients wife and advice.

## 2017-03-29 ENCOUNTER — Ambulatory Visit (INDEPENDENT_AMBULATORY_CARE_PROVIDER_SITE_OTHER): Payer: BLUE CROSS/BLUE SHIELD | Admitting: General Surgery

## 2017-03-29 ENCOUNTER — Encounter: Payer: Self-pay | Admitting: General Surgery

## 2017-03-29 VITALS — BP 140/90 | HR 100 | Temp 98.3°F | Ht 73.0 in | Wt 287.0 lb

## 2017-03-29 DIAGNOSIS — R1084 Generalized abdominal pain: Secondary | ICD-10-CM

## 2017-03-29 NOTE — Addendum Note (Signed)
Addended by: Nicole KindredBROUILLARD, ANGELA K on: 03/29/2017 12:05 PM   Modules accepted: Orders

## 2017-03-29 NOTE — Addendum Note (Signed)
Addended by: Nicole KindredBROUILLARD, ANGELA K on: 03/29/2017 11:53 AM   Modules accepted: Orders

## 2017-03-29 NOTE — Patient Instructions (Signed)
We have sent in a lab test for you to complete called a C-Diff. Once we get results back from this test we will advise you of the next plan of action.  We have also sent over a referral to [facility] as well.  If you have any questions or concerns please give our office a call.

## 2017-03-29 NOTE — Progress Notes (Signed)
Outpatient Surgical Follow Up  03/29/2017  Russell Kane is an 31 y.o. male.   Chief Complaint  Patient presents with  . Routine Post Op     Post Op: Laparoscopic Appendectomy 01/24/2017 Dr. Shellia CarwinWoodham-Patient continues to have loose stools and abd cramping    HPI: 31 year old male returns to clinic now 2 months status post laparoscopic appendectomy.  Patient reports that since surgery he has had persistent and continued postprandial cramping followed by loose stools.  He states he is maybe had 3 or 4 normal bowel movement since surgery and the remainder of been loose.  He has been maintaining hydration and states he does not have the urge to have a bowel movement after drinking fluids.  However, he states that after every meal he has a sudden cramping abdominal pain followed by a trip to the bathroom.  He denies any fevers, chills, chest pain, shortness of breath, nausea, vomiting.  Patient reports that over the last several weeks he also had a sinus infection for which she was prescribed a different antibiotic which worsened this postprandial diarrhea.  He was evaluated for this approximately 6 weeks ago and had a C. difficile test ordered that he never completed secondary to his spouse becoming ill.  Past Medical History:  Diagnosis Date  . ADD (attention deficit disorder)   . Allergic rhinitis   . ETOH abuse   . GERD (gastroesophageal reflux disease)   . Hypersomnia   . Parasomnia     Past Surgical History:  Procedure Laterality Date  . dislocated shoulder  2004 and 2006   Left  . LAPAROSCOPIC APPENDECTOMY N/A 01/24/2017   Procedure: APPENDECTOMY LAPAROSCOPIC;  Surgeon: Ricarda FrameWoodham, Lin Hackmann, MD;  Location: [facility] ORS;  Service: General;  Laterality: N/A;  . TONSILLECTOMY    . WISDOM TOOTH EXTRACTION      Family History  Problem Relation Age of Onset  . Hypertension Mother   . Heart disease Maternal Grandfather   . Diverticulitis Maternal Grandmother        colostomy    Social  History:  reports that he has quit smoking. he has never used smokeless tobacco. He reports that he drinks alcohol. He reports that he does not use drugs.  Allergies:  Allergies  Allergen Reactions  . Penicillins Other (See Comments)    Has patient had a PCN reaction causing immediate rash, facial/tongue/throat swelling, SOB or lightheadedness with hypotension: No Has patient had a PCN reaction causing severe rash involving mucus membranes or skin necrosis: No Has patient had a PCN reaction that required hospitalization: No Has patient had a PCN reaction occurring within the last 10 years: No If all of the above answers are "NO", then may proceed with Cephalosporin use.     Medications reviewed.    ROS A multipoint review of systems was completed, all pertinent positives and negatives are documented within the HPI and the remainder are negative   BP 140/90   Pulse 100   Temp 98.3 F (36.8 C) (Oral)   Ht 6\' 1"  (1.854 m)   Wt 130.2 kg (287 lb)   BMI 37.87 kg/m   Physical Exam General: No acute distress Neck: Supple and nontender Chest: Clear to auscultation Heart: Regular rate and rhythm Abdomen: Soft, nontender, nondistended Extremities: Moves all extremities well without any evidence of edema or cyanosis.    No results found for this or any previous visit (from the past 48 hour(s)). No results found.  Assessment/Plan:  1. Abdominal cramping, generalized 31 year old male presents  for evaluation of persistent abdominal cramping and diarrhea since his appendectomy 2 months ago.  His abdominal exam is completely benign today.  The absence of abdominal pain or fevers and chills makes the risk of intra-abdominal abscess very small.  However given his persistent diarrhea discussed that we would obtain a C. difficile test and put in for a GI referral for further evaluation.  Discussed that this is likely not related to his appendectomy but may be related to antibiotic use.   Discussed that we will call the patient with the results of his C. difficile and prescribed antibiotic therapy should be positive, if it is negative discussed that we will just continue with the GI referral. - Clostridium Difficile by PCR; Future  A total of 25 minutes was used on this encounter with greater than 50% of it used for counseling or coordination of care.   Ricarda Frameharles Kayin Osment, MD FACS General Surgeon  03/29/2017,11:16 AM

## 2017-04-05 ENCOUNTER — Other Ambulatory Visit: Payer: Self-pay

## 2017-04-05 ENCOUNTER — Other Ambulatory Visit: Payer: Self-pay | Admitting: General Surgery

## 2017-04-05 DIAGNOSIS — R197 Diarrhea, unspecified: Secondary | ICD-10-CM

## 2017-04-12 LAB — CLOSTRIDIUM DIFFICILE BY PCR: Toxigenic C. Difficile by PCR: NEGATIVE

## 2017-04-18 ENCOUNTER — Telehealth: Payer: Self-pay

## 2017-04-18 NOTE — Telephone Encounter (Signed)
Patient called to get C-Diff results. I verbalized to him that the results were negative and that he should be hearing from  GI within the next 24-48 hours to be seen. I verbalized to him that if he does not hear from their office by Friday then to give our office a call. He verbalized understanding.

## 2017-05-11 ENCOUNTER — Ambulatory Visit: Payer: BLUE CROSS/BLUE SHIELD | Admitting: Gastroenterology

## 2017-05-15 ENCOUNTER — Other Ambulatory Visit: Payer: Self-pay

## 2017-05-15 ENCOUNTER — Ambulatory Visit: Payer: BLUE CROSS/BLUE SHIELD | Admitting: Gastroenterology

## 2017-05-15 ENCOUNTER — Encounter: Payer: Self-pay | Admitting: Gastroenterology

## 2017-05-15 VITALS — BP 133/92 | HR 99 | Temp 98.0°F | Ht 73.0 in | Wt 288.6 lb

## 2017-05-15 DIAGNOSIS — R5383 Other fatigue: Secondary | ICD-10-CM

## 2017-05-15 DIAGNOSIS — R945 Abnormal results of liver function studies: Secondary | ICD-10-CM

## 2017-05-15 DIAGNOSIS — R194 Change in bowel habit: Secondary | ICD-10-CM | POA: Diagnosis not present

## 2017-05-15 DIAGNOSIS — R7989 Other specified abnormal findings of blood chemistry: Secondary | ICD-10-CM

## 2017-05-15 DIAGNOSIS — R197 Diarrhea, unspecified: Secondary | ICD-10-CM

## 2017-05-15 MED ORDER — DICYCLOMINE HCL 10 MG PO CAPS: 10.0000 mg | ORAL_CAPSULE | Freq: Two times a day (BID) | ORAL | 0 refills | Status: DC | PRN

## 2017-05-15 NOTE — Progress Notes (Signed)
Arlyss Repress, MD [address]  [address]  [place], [place] [address]  Main: [phone]  Fax: [phone]    Gastroenterology Consultation  Referring Provider:     Ricarda Frame, MD Primary Care Physician:  [facility] Primary Gastroenterologist:  Dr. Arlyss Repress Reason for Consultation:     Increased bowel frequency, abnormal LFTs        HPI:   Russell Kane is a 32 y.o. white male referred by Dr. Eli Phillips, [facility]  for consultation & management of about 3 months history of increased bowel frequency. He had an attack of acute appendicitis and underwent appendectomy in 01/2017. Since then, he has been experiencing diarrhea which was initially about 4-5 times per day, not bleeding, loose. He reports that 2 weeks ago it started to slow down. Currently, having 2-3 bowel movements per day. Denies urgency, nocturnal diarrhea or incontinence. He was tested negative for C. Difficile. He denies any abdominal cramps, gas, bloating. He was given antibiotics in the perioperative period. He denies any weight loss, in fact gained about 5-10 pounds since surgery. He also has history of elevated LFTs, was thought to be secondary to fatty liver. He had an ultrasound in 2013 which showed hepatic steatosis, at that time he lost about 30 pounds with diet and exercise which resulted in normalization of transaminases. His transaminases are mildly elevated again. He reports consuming sweetened tea daily, also consumes red meat on a regular basis.He also reports feeling tired during the day. He was diagnosed with sleep apnea several years ago at the [place] but did not require CPAP at that time. He does snore at night.  NSAIDs: none  Antiplts/Anticoagulants/Anti thrombotics: none  GI Procedures: none Grandmother with ulcerative colitis Denies family history of liver disease or other GI conditions including malignancy    Past Medical History:  Diagnosis Date  . ADD  (attention deficit disorder)   . Allergic rhinitis   . ETOH abuse   . GERD (gastroesophageal reflux disease)   . Hypersomnia   . Parasomnia     Past Surgical History:  Procedure Laterality Date  . dislocated shoulder  2004 and 2006   Left  . LAPAROSCOPIC APPENDECTOMY N/A 01/24/2017   Procedure: APPENDECTOMY LAPAROSCOPIC;  Surgeon: Ricarda Frame, MD;  Location: [facility] ORS;  Service: General;  Laterality: N/A;  . TONSILLECTOMY    . WISDOM TOOTH EXTRACTION       Current Outpatient Medications:  .  amphetamine-dextroamphetamine (ADDERALL) 20 MG tablet, Take 20 mg by mouth., Disp: , Rfl:  .  clonazePAM (KLONOPIN) 0.5 MG tablet, Take 0.5 mg by mouth at bedtime., Disp: , Rfl:  .  esomeprazole (NEXIUM) 20 MG capsule, Take 20 mg by mouth., Disp: , Rfl:  .  loratadine (CLARITIN) 10 MG tablet, Take 10 mg by mouth., Disp: , Rfl:  .  dicyclomine (BENTYL) 10 MG capsule, Take 1 capsule (10 mg total) by mouth 2 (two) times daily as needed for up to 30 doses for spasms., Disp: 30 capsule, Rfl: 0  Family History  Problem Relation Age of Onset  . Hypertension Mother   . Heart disease Maternal Grandfather   . Diverticulitis Maternal Grandmother        colostomy     Social History   Tobacco Use  . Smoking status: Former Games developer  . Smokeless tobacco: Never Used  Substance Use Topics  . Alcohol use: Yes    Comment: Social  . Drug use: No  Allergies as of 05/15/2017 - Review Complete 05/15/2017  Allergen Reaction Noted  . Penicillins Other (See Comments)     Review of Systems:    All systems reviewed and negative except where noted in HPI.   Physical Exam:  BP (!) 133/92   Pulse 99   Temp 98 F (36.7 C)   Ht 6\' 1"  (1.854 m)   Wt 288 lb 9.6 oz (130.9 kg)   BMI 38.08 kg/m  No LMP for male patient.  General:   Alert,  Well-developed, well-nourished, pleasant and cooperative in NAD Head:  Normocephalic and atraumatic. Eyes:  Sclera clear, no icterus.   Conjunctiva pink. Ears:   Normal auditory acuity. Nose:  No deformity, discharge, or lesions. Mouth:  No deformity or lesions,oropharynx pink & moist. Neck:  Supple; no masses or thyromegaly. Lungs:  Respirations even and unlabored.  Clear throughout to auscultation.   No wheezes, crackles, or rhonchi. No acute distress. Heart:  Regular rate and rhythm; no murmurs, clicks, rubs, or gallops. Abdomen:  Normal bowel sounds. Soft, non-tender and non-distended without masses, hepatosplenomegaly or hernias noted.  No guarding or rebound tenderness.   Rectal: Not performed Msk:  Symmetrical without gross deformities. Good, equal movement & strength bilaterally. Pulses:  Normal pulses noted. Extremities:  No clubbing or edema.  No cyanosis. Neurologic:  Alert and oriented x3;  grossly normal neurologically. Skin:  Intact without significant lesions or rashes. No jaundice. Lymph Nodes:  No significant cervical adenopathy. Psych:  Alert and cooperative. Normal mood and affect.  Imaging Studies: reviewed  Assessment and Plan:   Russell Kane is a 32 y.o. male with obesity,with recent attack of acute appendicitis status post laparoscopic appendectomy is seen in consultation for increased bowel frequency and abnormal LFTs, fatigue  Increased bowel frequency: Stool studies negative for C. difficile He does not have any alarm signs and symptoms and diarrhea is improving This is most likely in the setting of antibiotic use and consuming red meat as well as high sugars Suggested him to try Bentyl 10 mg as needed Encouraged to follow a healthy diet, and avoid red meat and sweetened tea If symptoms persist, I will discuss with him about further evaluation  Elevated transaminases: He is immune to hepatitis A and B HCV antibody negative Ultrasound in the past revealed hepatic steatosis which is most likely the etiology Discussed with him about management of fatty liver that includes weight loss but following healthy diet and  exercise Recheck LFTs today  Fatigue: Check TSH, CBC, BMP, vitamin D, hemoglobin A1c, fasting lipids I also suggested him to get evaluated for sleep apnea  Follow up in 3 months   Arlyss Repressohini R Lumi Winslett, MD

## 2017-05-17 LAB — COMPREHENSIVE METABOLIC PANEL
A/G RATIO: 1.5 (ref 1.2–2.2)
ALBUMIN: 4.7 g/dL (ref 3.5–5.5)
ALK PHOS: 56 IU/L (ref 39–117)
ALT: 69 IU/L — ABNORMAL HIGH (ref 0–44)
AST: 38 IU/L (ref 0–40)
BILIRUBIN TOTAL: 0.7 mg/dL (ref 0.0–1.2)
BUN / CREAT RATIO: 17 (ref 9–20)
BUN: 23 mg/dL — AB (ref 6–20)
CHLORIDE: 100 mmol/L (ref 96–106)
CO2: 24 mmol/L (ref 20–29)
Calcium: 10 mg/dL (ref 8.7–10.2)
Creatinine, Ser: 1.38 mg/dL — ABNORMAL HIGH (ref 0.76–1.27)
GFR calc Af Amer: 77 mL/min/{1.73_m2} (ref >59)
GFR calc non Af Amer: 67 mL/min/{1.73_m2} (ref >59)
GLUCOSE: 99 mg/dL (ref 65–99)
Globulin, Total: 3.2 g/dL (ref 1.5–4.5)
POTASSIUM: 4.6 mmol/L (ref 3.5–5.2)
SODIUM: 140 mmol/L (ref 134–144)
Total Protein: 7.9 g/dL (ref 6.0–8.5)

## 2017-05-17 LAB — VITAMIN D 25 HYDROXY (VIT D DEFICIENCY, FRACTURES): Vit D, 25-Hydroxy: 35.3 ng/mL (ref 30.0–100.0)

## 2017-05-17 LAB — CBC
HEMATOCRIT: 46.9 % (ref 37.5–51.0)
Hemoglobin: 16.9 g/dL (ref 13.0–17.7)
MCH: 32.2 pg (ref 26.6–33.0)
MCHC: 36 g/dL — AB (ref 31.5–35.7)
MCV: 89 fL (ref 79–97)
PLATELETS: 268 10*3/uL (ref 150–379)
RBC: 5.25 x10E6/uL (ref 4.14–5.80)
RDW: 13.8 % (ref 12.3–15.4)
WBC: 8.9 10*3/uL (ref 3.4–10.8)

## 2017-05-17 LAB — LIPID PANEL
CHOL/HDL RATIO: 4.3 ratio (ref 0.0–5.0)
Cholesterol, Total: 195 mg/dL (ref 100–199)
HDL: 45 mg/dL (ref >39)
LDL Calculated: 134 mg/dL — ABNORMAL HIGH (ref 0–99)
Triglycerides: 80 mg/dL (ref 0–149)
VLDL CHOLESTEROL CAL: 16 mg/dL (ref 5–40)

## 2017-05-17 LAB — HEMOGLOBIN A1C
Est. average glucose Bld gHb Est-mCnc: 108 mg/dL
HEMOGLOBIN A1C: 5.4 % (ref 4.8–5.6)

## 2017-05-17 LAB — TSH: TSH: 2 u[IU]/mL (ref 0.450–4.500)

## 2017-08-07 ENCOUNTER — Encounter: Payer: Self-pay | Admitting: Gastroenterology

## 2017-08-07 ENCOUNTER — Ambulatory Visit: Payer: BLUE CROSS/BLUE SHIELD | Admitting: Gastroenterology

## 2018-02-04 ENCOUNTER — Ambulatory Visit (INDEPENDENT_AMBULATORY_CARE_PROVIDER_SITE_OTHER): Payer: BLUE CROSS/BLUE SHIELD

## 2018-02-04 ENCOUNTER — Other Ambulatory Visit: Payer: Self-pay

## 2018-02-04 ENCOUNTER — Ambulatory Visit
Admission: EM | Admit: 2018-02-04 | Discharge: 2018-02-04 | Disposition: A | Discharge status: Home / Self Care | Payer: BLUE CROSS/BLUE SHIELD | Attending: Internal Medicine | Admitting: Internal Medicine

## 2018-02-04 ENCOUNTER — Encounter: Payer: Self-pay | Admitting: Emergency Medicine

## 2018-02-04 DIAGNOSIS — M542 Cervicalgia: Secondary | ICD-10-CM | POA: Diagnosis not present

## 2018-02-04 DIAGNOSIS — X58XXXA Exposure to other specified factors, initial encounter: Secondary | ICD-10-CM | POA: Diagnosis not present

## 2018-02-04 DIAGNOSIS — S161XXA Strain of muscle, fascia and tendon at neck level, initial encounter: Secondary | ICD-10-CM

## 2018-02-04 MED ORDER — METHOCARBAMOL 750 MG PO TABS: 750.0000 mg | ORAL_TABLET | Freq: Four times a day (QID) | ORAL | 0 refills | Status: AC

## 2018-02-04 MED ORDER — OXAPROZIN 600 MG PO TABS: ORAL_TABLET | ORAL | 0 refills | Status: AC

## 2018-02-04 MED ORDER — KETOROLAC TROMETHAMINE 60 MG/2ML IM SOLN
60.0000 mg | Freq: Once | INTRAMUSCULAR | Status: AC
  Administered 2018-02-04: 60 mg via INTRAMUSCULAR

## 2018-02-04 NOTE — Discharge Instructions (Signed)
Use ice for 20 min 3-4 times a day for 48 from the injury, after that alternate to heat and do neck stretches after the heat. Follow up with your family Dr in 7-10 day. Physical therapy and chiropractic care can help you with your healing as well and your family Dr can send you there if you are not healing fast enough.
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
## 2018-02-04 NOTE — ED Triage Notes (Signed)
Patient in today c/o neck pain since yesterday morning. Patient thought he had slept wrong, but has gotten progressively worse and radiating to his arm.

## 2018-02-06 ENCOUNTER — Emergency Department (HOSPITAL_COMMUNITY): Payer: BLUE CROSS/BLUE SHIELD

## 2018-02-06 ENCOUNTER — Emergency Department (HOSPITAL_COMMUNITY)
Admission: EM | Admit: 2018-02-06 | Discharge: 2018-02-06 | Disposition: A | Discharge status: Home / Self Care | Payer: BLUE CROSS/BLUE SHIELD | Attending: Emergency Medicine | Admitting: Emergency Medicine

## 2018-02-06 ENCOUNTER — Other Ambulatory Visit: Payer: Self-pay

## 2018-02-06 ENCOUNTER — Encounter (HOSPITAL_COMMUNITY): Payer: Self-pay | Admitting: *Deleted

## 2018-02-06 DIAGNOSIS — R0789 Other chest pain: Secondary | ICD-10-CM | POA: Diagnosis not present

## 2018-02-06 DIAGNOSIS — Y929 Unspecified place or not applicable: Secondary | ICD-10-CM | POA: Diagnosis not present

## 2018-02-06 DIAGNOSIS — Y999 Unspecified external cause status: Secondary | ICD-10-CM | POA: Insufficient documentation

## 2018-02-06 DIAGNOSIS — Z79899 Other long term (current) drug therapy: Secondary | ICD-10-CM | POA: Insufficient documentation

## 2018-02-06 DIAGNOSIS — X58XXXA Exposure to other specified factors, initial encounter: Secondary | ICD-10-CM | POA: Insufficient documentation

## 2018-02-06 DIAGNOSIS — S199XXA Unspecified injury of neck, initial encounter: Secondary | ICD-10-CM | POA: Diagnosis present

## 2018-02-06 DIAGNOSIS — F909 Attention-deficit hyperactivity disorder, unspecified type: Secondary | ICD-10-CM | POA: Insufficient documentation

## 2018-02-06 DIAGNOSIS — S161XXA Strain of muscle, fascia and tendon at neck level, initial encounter: Secondary | ICD-10-CM | POA: Diagnosis not present

## 2018-02-06 DIAGNOSIS — Y9389 Activity, other specified: Secondary | ICD-10-CM | POA: Insufficient documentation

## 2018-02-06 LAB — CBC
HCT: 50.7 % (ref 39.0–52.0)
Hemoglobin: 16.1 g/dL (ref 13.0–17.0)
MCH: 29 pg (ref 26.0–34.0)
MCHC: 31.8 g/dL (ref 30.0–36.0)
MCV: 91.4 fL (ref 80.0–100.0)
NRBC: 0 % (ref 0.0–0.2)
PLATELETS: 268 10*3/uL (ref 150–400)
RBC: 5.55 MIL/uL (ref 4.22–5.81)
RDW: 12.6 % (ref 11.5–15.5)
WBC: 7.4 10*3/uL (ref 4.0–10.5)

## 2018-02-06 LAB — BASIC METABOLIC PANEL
ANION GAP: 8 (ref 5–15)
BUN: 19 mg/dL (ref 6–20)
CALCIUM: 9.1 mg/dL (ref 8.9–10.3)
CO2: 24 mmol/L (ref 22–32)
CREATININE: 1.34 mg/dL — AB (ref 0.61–1.24)
Chloride: 105 mmol/L (ref 98–111)
GFR calc non Af Amer: >60 mL/min (ref >60)
GLUCOSE: 91 mg/dL (ref 70–99)
Potassium: 4.1 mmol/L (ref 3.5–5.1)
SODIUM: 137 mmol/L (ref 135–145)

## 2018-02-06 LAB — I-STAT TROPONIN, ED: Troponin i, poc: 0.01 ng/mL (ref 0.00–0.08)

## 2018-02-06 MED ORDER — CYCLOBENZAPRINE HCL 10 MG PO TABS: 10.0000 mg | ORAL_TABLET | Freq: Two times a day (BID) | ORAL | 0 refills | Status: AC | PRN

## 2018-02-06 MED ORDER — OXYCODONE-ACETAMINOPHEN 5-325 MG PO TABS
1.0000 | ORAL_TABLET | Freq: Once | ORAL | Status: AC
  Administered 2018-02-06: 1 via ORAL
  Filled 2018-02-06: qty 1

## 2018-02-06 MED ORDER — TRAMADOL HCL 50 MG PO TABS: 50.0000 mg | ORAL_TABLET | Freq: Four times a day (QID) | ORAL | 0 refills | Status: AC | PRN

## 2018-02-06 NOTE — ED Notes (Signed)
Pt stable, ambulatory, states understanding of discharge instructions 
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
## 2018-02-06 NOTE — ED Triage Notes (Signed)
Pt in c/o pain in his neck that started on Sunday, worse with movement, over the last few days pain has moved into his left shoulder and left chest as well, pain still worse with movement but is constant, has also developed a cough, went to urgent care and they did an xray on his neck and prescribed him muscle relaxers which did not help

## 2018-02-06 NOTE — Discharge Instructions (Signed)
Please read attached information. If you experience any new or worsening signs or symptoms please return to the emergency room for evaluation. Please follow-up with your primary care provider or specialist as discussed. Please use medication prescribed only as directed and discontinue taking if you have any concerning signs or symptoms.   °
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
## 2019-01-02 ENCOUNTER — Other Ambulatory Visit: Payer: Self-pay

## 2019-01-02 ENCOUNTER — Encounter: Payer: Self-pay | Admitting: Neurology

## 2019-01-02 DIAGNOSIS — R200 Anesthesia of skin: Secondary | ICD-10-CM

## 2019-02-11 ENCOUNTER — Other Ambulatory Visit: Payer: Self-pay

## 2019-02-11 ENCOUNTER — Ambulatory Visit (INDEPENDENT_AMBULATORY_CARE_PROVIDER_SITE_OTHER): Payer: Commercial Managed Care - PPO | Admitting: Neurology

## 2019-02-11 DIAGNOSIS — R200 Anesthesia of skin: Secondary | ICD-10-CM

## 2019-02-11 DIAGNOSIS — G5603 Carpal tunnel syndrome, bilateral upper limbs: Secondary | ICD-10-CM

## 2019-02-11 DIAGNOSIS — G5622 Lesion of ulnar nerve, left upper limb: Secondary | ICD-10-CM

## 2019-02-11 NOTE — Procedures (Signed)
[facility]  [address] [place], [address]  [place], [place] [address] Tel: [phone] Fax:  [phone] Test Date:  02/11/2019  Patient: Russell Kane DOB: 03-26-1986 Physician: Nita Sickle, DO  Sex: Male Height: 6\' 1"  Ref Phys: , MD  ID#: [id number] Salt Temp: 32.0 Technician:    Patient Complaints: This is a 33 year old man referred for evaluation of bilateral hand paresthesias.  NCV & EMG Findings: Extensive electrodiagnostic testing of the right upper extremity and additional studies of the left shows:  1. Bilateral median and left ulnar sensory responses show prolonged latencies (R4.4, L4.4, L3.3 ms).  Right ulnar sensory responses within normal limits. 2. Bilateral median motor responses show prolonged latencies (R4.4, L4.3 ms).  Left ulnar motor response shows slowed conduction velocity across the elbow (A Elbow-B Elbow, 45 m/s).  Right ulnar motor responses within normal limits.   3. 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    Impression: 1. Bilateral median neuropathy at or distal to the wrist (moderate), consistent with a clinical diagnosis of carpal tunnel syndrome.   2. Left ulnar neuropathy with slowing across the elbow, purely demyelinating, mild.     ___________________________ 31, DO    Nerve Conduction Studies Anti Sensory Summary Table   Site NR Peak (ms) Norm Peak (ms) P-T Amp (V) Norm P-T Amp  Left Median Anti Sensory (2nd Digit)  32C  Wrist    4.4 <3.4 25.2 >20  Right Median Anti Sensory (2nd Digit)  32C  Wrist    4.4 <3.4 25.5 >20  Left Ulnar Anti Sensory (5th Digit)  32C  Wrist    3.3 <3.1 20.2 >12  Right Ulnar Anti Sensory (5th Digit)  32C  Wrist    3.1 <3.1 20.8 >12   Motor Summary Table   Site NR Onset (ms) Norm Onset (ms) O-P Amp (mV) Norm O-P Amp Site1 Site2 Delta-0 (ms) Dist (cm) Vel (m/s) Norm Vel (m/s)  Left  Median Motor (Abd Poll Brev)  32C  Wrist    4.3 <3.9 10.5 >6 Elbow Wrist 5.2 31.0 60 >50  Elbow    9.5  9.8         Right Median Motor (Abd Poll Brev)  32C  Wrist    4.4 <3.9 10.1 >6 Elbow Wrist 5.3 32.0 60 >50  Elbow    9.7  9.8         Left Ulnar Motor (Abd Dig Minimi)  32C  Wrist    2.7 <3.1 8.5 >7 B Elbow Wrist 4.3 25.5 59 >50  B Elbow    7.0  7.8  A Elbow B Elbow 2.2 10.0 45 >50  A Elbow    9.2  7.7         Right Ulnar Motor (Abd Dig Minimi)  32C  Wrist    2.3 <3.1 9.3 >7 B Elbow Wrist 4.4 25.5 58 >50  B Elbow    6.7  8.9  A Elbow B Elbow 2.0 10.0 50 >50  A Elbow    8.7  8.6          EMG   Side Muscle Ins Act Fibs Psw Fasc Number Recrt Dur Dur. Amp Amp. Poly Poly. Comment  Right 1stDorInt Nml Nml Nml Nml Nml Nml Nml Nml Nml Nml Nml Nml N/A  Right Abd Poll Brev Nml Nml Nml Nml 1- Rapid Few 1+ Few 1+ Nml Nml N/A  Right PronatorTeres Nml Nml Nml  Nml Nml Nml Nml Nml Nml Nml Nml Nml N/A  Right Biceps Nml Nml Nml Nml Nml Nml Nml Nml Nml Nml Nml Nml N/A  Right Triceps Nml Nml Nml Nml Nml Nml Nml Nml Nml Nml Nml Nml N/A  Right Deltoid Nml Nml Nml Nml Nml Nml Nml Nml Nml Nml Nml Nml N/A  Left 1stDorInt Nml Nml Nml Nml Nml Nml Nml Nml Nml Nml Nml Nml N/A  Left Abd Poll Brev Nml Nml Nml Nml 1- Rapid Few 1+ Few 1+ Nml Nml N/A  Left PronatorTeres Nml Nml Nml Nml Nml Nml Nml Nml Nml Nml Nml Nml N/A  Left Biceps Nml Nml Nml Nml Nml Nml Nml Nml Nml Nml Nml Nml N/A  Left Triceps Nml Nml Nml Nml Nml Nml Nml Nml Nml Nml Nml Nml N/A  Left Cervical Parasp Low Nml Nml Nml Nml Nml Nml Nml Nml Nml Nml Nml Nml N/A  Left FlexCarpiUln Nml Nml Nml Nml Nml Nml Nml Nml Nml Nml Nml Nml N/A      Waveforms:
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
## 2023-01-23 DIAGNOSIS — K219 Gastro-esophageal reflux disease without esophagitis: Secondary | ICD-10-CM | POA: Diagnosis not present

## 2023-01-23 DIAGNOSIS — G5603 Carpal tunnel syndrome, bilateral upper limbs: Secondary | ICD-10-CM | POA: Diagnosis not present

## 2023-01-23 DIAGNOSIS — G5601 Carpal tunnel syndrome, right upper limb: Secondary | ICD-10-CM | POA: Diagnosis not present

## 2023-01-23 DIAGNOSIS — R7989 Other specified abnormal findings of blood chemistry: Secondary | ICD-10-CM | POA: Diagnosis not present

## 2024-02-12 DIAGNOSIS — H524 Presbyopia: Secondary | ICD-10-CM | POA: Diagnosis not present
